# Patient Record
Sex: Male | Born: 1985 | Race: Black or African American | Hispanic: No | Marital: Single | Smoking: Never smoker
Health system: Southern US, Community
[De-identification: ages and names within clinical notes are randomized; demographics above are authoritative.]

## PROBLEM LIST (undated history)

## (undated) DIAGNOSIS — N200 Calculus of kidney: Secondary | ICD-10-CM

## (undated) HISTORY — PX: APPENDECTOMY: SHX54

---
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
## 2005-07-10 ENCOUNTER — Emergency Department (HOSPITAL_COMMUNITY): Admission: EM | Admit: 2005-07-10 | Discharge: 2005-07-10 | Payer: Self-pay | Admitting: Emergency Medicine

## 2005-10-05 ENCOUNTER — Emergency Department (HOSPITAL_COMMUNITY): Admission: EM | Admit: 2005-10-05 | Discharge: 2005-10-05 | Payer: Self-pay | Admitting: Emergency Medicine

## 2006-05-05 ENCOUNTER — Emergency Department (HOSPITAL_COMMUNITY): Admission: EM | Admit: 2006-05-05 | Discharge: 2006-05-05 | Payer: Self-pay | Admitting: Emergency Medicine

## 2006-09-05 ENCOUNTER — Emergency Department (HOSPITAL_COMMUNITY): Admission: EM | Admit: 2006-09-05 | Discharge: 2006-09-05 | Payer: Self-pay | Admitting: Emergency Medicine

## 2006-10-14 ENCOUNTER — Emergency Department (HOSPITAL_COMMUNITY): Admission: EM | Admit: 2006-10-14 | Discharge: 2006-10-14 | Payer: Self-pay | Admitting: Emergency Medicine

## 2006-11-11 ENCOUNTER — Emergency Department (HOSPITAL_COMMUNITY): Admission: EM | Admit: 2006-11-11 | Discharge: 2006-11-11 | Payer: Self-pay | Admitting: Emergency Medicine

## 2007-07-18 ENCOUNTER — Emergency Department (HOSPITAL_COMMUNITY): Admission: EM | Admit: 2007-07-18 | Discharge: 2007-07-18 | Payer: Self-pay | Admitting: Emergency Medicine

## 2007-11-12 ENCOUNTER — Emergency Department (HOSPITAL_COMMUNITY): Admission: EM | Admit: 2007-11-12 | Discharge: 2007-11-12 | Payer: Self-pay | Admitting: Emergency Medicine

## 2007-12-18 ENCOUNTER — Emergency Department (HOSPITAL_COMMUNITY): Admission: EM | Admit: 2007-12-18 | Discharge: 2007-12-18 | Payer: Self-pay | Admitting: Emergency Medicine

## 2008-02-20 ENCOUNTER — Emergency Department (HOSPITAL_COMMUNITY): Admission: EM | Admit: 2008-02-20 | Discharge: 2008-02-20 | Payer: Self-pay | Admitting: Emergency Medicine

## 2011-02-09 NOTE — Op Note (Signed)
NAME:  [name], Travis Mayer                   ACCOUNT NO.:  [id number]   MEDICAL RECORD NO.:  [id number]                   PATIENT TYPE:  EMS   LOCATION:  ED                                   FACILITY:  [facility]   PHYSICIAN:  Dalia Heading, M.D.               DATE OF BIRTH:  01-17-1986   DATE OF PROCEDURE:  11/25/2002  DATE OF DISCHARGE:                                 OPERATIVE REPORT   PREOPERATIVE DIAGNOSIS:  Acute appendicitis.   POSTOPERATIVE DIAGNOSIS:  Acute appendicitis.   PROCEDURE:  Laparoscopic appendectomy.   SURGEON:  Dalia Heading, M.D.   ANESTHESIA:  General endotracheal   INDICATIONS:  The patient is a 25 year old black male who presented to the  emergency room with right lower quadrant abdominal pain.  CT scan of the  abdomen and pelvis reveals acute appendicitis.  The risks and benefits of  the procedure including bleeding, infection, the possibility of an open  procedure were fully explained to the patient's guardian, who gave informed  consent for the patient, as the patient is a minor.   DESCRIPTION OF PROCEDURE:  The patient was placed in the supine position.  After induction of general endotracheal anesthesia, the abdomen was prepped  and draped using the usual sterile technique with Betadine.  Surgical site  confirmation was performed.   A supraumbilical incision was made down to the fascia.  A Veress needle was  introduced into the abdominal cavity and confirmation of placement was done  using the saline drop test.  The abdomen was then insufflated to 16 mmHg  pressure.  An 11-mm trocar was introduced into the abdominal cavity under  direct visualization without difficulty.  The patient was placed in deeper  Trendelenburg position and an 11-mm trocar was placed in the suprapubic  region and a 5-mm trocar was placed in the left lower quadrant region.   The appendix was visualized and noted to be inflamed. There was no purulent  fluid present.  The  mesoappendix was divided using the harmonic scalpel.  An  Endo-GIA was placed across the base of the appendix and fired.  The appendix  was delivered through the suprapubic trocar side using an EndoCatch bag.  The staple line was inspected and noted to be within normal limits.  All  fluid and air were then evacuated from the abdominal cavity prior to removal  of the trocars.   All wounds were irrigated with normal saline.  All wounds were injected with  0.5% Sensorcaine.  The supraumbilical fascia as well as suprapubic fascia  were reapproximated using an #0 Vicryl interrupted suture. All skin  incisions were closed using staples.  Betadine ointment and dry sterile  dressings were applied.   All tape and needle counts were correct at the end of the procedure.  The  patient was extubated in the operating room and went to the recovery room  awake and stable condition.   COMPLICATIONS:  None.   SPECIMEN:  Appendix.   BLOOD LOSS:  Minimal.                                               Dalia Heading, M.D.    MAJ/MEDQ  D:  11/25/2002  T:  11/26/2002  Job:  [id number]   cc:   Dr. Clista Bernhardt   [place]

## 2011-06-15 LAB — URINALYSIS, ROUTINE W REFLEX MICROSCOPIC
Bilirubin Urine: NEGATIVE
Glucose, UA: NEGATIVE
Hgb urine dipstick: NEGATIVE
Nitrite: NEGATIVE
Protein, ur: NEGATIVE
Specific Gravity, Urine: >1.03 — ABNORMAL HIGH
Urobilinogen, UA: 0.2
pH: 5.5

## 2011-07-04 LAB — FECAL LACTOFERRIN, QUANT: Fecal Lactoferrin: POSITIVE

## 2011-07-04 LAB — COMPREHENSIVE METABOLIC PANEL
ALT: 13
AST: 17
Albumin: 4.6
Alkaline Phosphatase: 73
BUN: 15
CO2: 30
Calcium: 10
Chloride: 107
Creatinine, Ser: 1.17
GFR calc Af Amer: >60
GFR calc non Af Amer: >60
Glucose, Bld: 134 — ABNORMAL HIGH
Potassium: 4.2
Sodium: 141
Total Bilirubin: 0.9
Total Protein: 7.9

## 2011-07-04 LAB — CBC
HCT: 49
Hemoglobin: 16.9
MCHC: 34.4
MCV: 89.6
Platelets: 201
RBC: 5.47
RDW: 13.2
WBC: 11.5 — ABNORMAL HIGH

## 2011-07-04 LAB — DIFFERENTIAL
Basophils Absolute: 0
Basophils Relative: 0
Eosinophils Absolute: 0
Eosinophils Relative: 0
Lymphocytes Relative: 3 — ABNORMAL LOW
Lymphs Abs: 0.3 — ABNORMAL LOW
Monocytes Absolute: 0.4
Monocytes Relative: 3
Neutro Abs: 10.7 — ABNORMAL HIGH
Neutrophils Relative %: 93 — ABNORMAL HIGH

## 2011-07-04 LAB — STOOL CULTURE

## 2011-07-04 LAB — OVA AND PARASITE EXAMINATION: Ova and parasites: NONE SEEN

## 2011-07-04 LAB — LIPASE, BLOOD: Lipase: 19

## 2016-06-13 ENCOUNTER — Emergency Department (HOSPITAL_COMMUNITY)
Admission: EM | Admit: 2016-06-13 | Discharge: 2016-06-14 | Disposition: A | Discharge status: Home / Self Care | Payer: Self-pay | Attending: Emergency Medicine | Admitting: Emergency Medicine

## 2016-06-13 ENCOUNTER — Encounter (HOSPITAL_COMMUNITY): Payer: Self-pay

## 2016-06-13 DIAGNOSIS — N201 Calculus of ureter: Secondary | ICD-10-CM | POA: Insufficient documentation

## 2016-06-13 HISTORY — DX: Calculus of kidney: N20.0

## 2016-06-13 NOTE — ED Triage Notes (Signed)
Pt c/o right flank pain that started this am, had gone away and returned tonight, states it feels like previous kidney stone.

## 2016-06-13 NOTE — ED Provider Notes (Signed)
[facility]-EMERGENCY DEPT Provider Note   CSN: [id number] Arrival date & time: 06/13/16  2332  By signing my name below, I, Javier Docker, attest that this documentation has been prepared under the direction and in the presence of Devoria Albe, MD. Electronically Signed: Javier Docker, ER Scribe. 05/05/2016. 12:04 AM.  Time seen 23:58 PM  History   Chief Complaint Chief Complaint  Patient presents with  . Flank Pain   HPI  HPI Comments: Travis Mayer is a 30 y.o. male who presents to the Emergency Department complaining of intermittent, dull RLQ and flank pain with associated nausea since this morning. He has a past surgical hx of appendectomy. His pain lasted three hours this morning, and then resolved until two hours ago when the pain recurred. His urine output is reduced.He feels the need to have a BM but doesn't. He denies hematuria. He does not drink milk. He drinks significant sweetened coffee drinks. He does not drink much water.He denies injury. He has a past hx kidney stones.   PCP none   Past Medical History:  Diagnosis Date  . Kidney stone     There are no active problems to display for this patient.   History reviewed. No pertinent surgical history.  OB History    No data available       Home Medications    Prior to Admission medications   Medication Sig Start Date End Date Taking? Authorizing Provider  ondansetron (ZOFRAN) 4 MG tablet Take 1 tablet (4 mg total) by mouth every 8 (eight) hours as needed. 06/14/16   Devoria Albe, MD  oxyCODONE-acetaminophen (PERCOCET/ROXICET) 5-325 MG tablet Take 1 tablet by mouth every 6 (six) hours as needed for severe pain. 06/14/16   Devoria Albe, MD  PERCOCET 5-325 MG tablet Take 1 tablet by mouth every 6 (six) hours as needed for severe pain. 06/14/16   Devoria Albe, MD  tamsulosin (FLOMAX) 0.4 MG CAPS capsule Take 1 po QD until you pass the stone. 06/14/16   Devoria Albe, MD    Family History No family history on  file.  Social History Social History  Substance Use Topics  . Smoking status: Never Smoker  . Smokeless tobacco: Never Used  . Alcohol use No   He does not drink or smoke.  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   Review of Systems Review of Systems  Constitutional: Negative for chills and fever.  Gastrointestinal: Positive for nausea. Negative for constipation and vomiting.  Genitourinary: Positive for decreased urine volume and flank pain.  All other systems reviewed and are negative.   Physical Exam Updated Vital Signs BP 137/92 (BP Location: Right Arm)   Pulse 76   Temp 97.4 F (36.3 C) (Oral)   Resp 18   Ht 6' (1.829 m)   Wt 205 lb (93 kg)   SpO2 99%   BMI 27.80 kg/m   Vital signs normal    Physical Exam  Constitutional: He is oriented to person, place, and time. He appears well-developed and well-nourished.  Non-toxic appearance. He does not appear ill. He appears distressed.  HENT:  Head: Normocephalic and atraumatic.  Right Ear: External ear normal.  Left Ear: External ear normal.  Nose: Nose normal. No mucosal edema or rhinorrhea.  Mouth/Throat: Oropharynx is clear and moist and mucous membranes are normal. No dental abscesses or uvula swelling.  Eyes: Conjunctivae and EOM are normal.  Pupils are equal, round, and reactive to light.  Neck: Normal range of motion and full passive range of motion without pain. Neck supple.  Cardiovascular: Normal rate, regular rhythm and normal heart sounds.  Exam reveals no gallop and no friction rub.   No murmur heard. Pulmonary/Chest: Effort normal and breath sounds normal. No respiratory distress. He has no wheezes. He has no rhonchi. He has no rales. He exhibits no tenderness and no crepitus.  Abdominal: Soft. Normal appearance and bowel sounds are normal. He exhibits no distension. There is tenderness. There is no rebound and no  guarding.  No flank pain to percussion, but that is the area he indicates that he hurts. He has mild tenderness in the right lower abdomen.   Musculoskeletal: Normal range of motion. He exhibits no edema or tenderness.  Moves all extremities well.   Neurological: He is alert and oriented to person, place, and time. He has normal strength. No cranial nerve deficit.  Skin: Skin is warm, dry and intact. No rash noted. No erythema. No pallor.  Psychiatric: He has a normal mood and affect. His speech is normal and behavior is normal. His mood appears not anxious.  Nursing note and vitals reviewed.    ED Treatments / Results  Labs (all labs ordered are listed, but only abnormal results are displayed) Results for orders placed or performed during the hospital encounter of 06/13/16  Urinalysis, Routine w reflex microscopic- may I&O cath if menses  Result Value Ref Range   Color, Urine YELLOW YELLOW   APPearance CLEAR CLEAR   Specific Gravity, Urine 1.025 1.005 - 1.030   pH 6.0 5.0 - 8.0   Glucose, UA NEGATIVE NEGATIVE mg/dL   Hgb urine dipstick LARGE (A) NEGATIVE   Bilirubin Urine NEGATIVE NEGATIVE   Ketones, ur TRACE (A) NEGATIVE mg/dL   Protein, ur NEGATIVE NEGATIVE mg/dL   Nitrite NEGATIVE NEGATIVE   Leukocytes, UA NEGATIVE NEGATIVE  Basic metabolic panel  Result Value Ref Range   Sodium 140 135 - 145 mmol/L   Potassium 3.9 3.5 - 5.1 mmol/L   Chloride 106 101 - 111 mmol/L   CO2 30 22 - 32 mmol/L   Glucose, Bld 132 (H) 65 - 99 mg/dL   BUN 20 6 - 20 mg/dL   Creatinine, Ser 1.61 (H) 0.61 - 1.24 mg/dL   Calcium 9.3 8.9 - 09.6 mg/dL   GFR calc non Af Amer >60 >60 mL/min   GFR calc Af Amer >60 >60 mL/min   Anion gap 4 (L) 5 - 15  CBC with Differential  Result Value Ref Range   WBC 7.8 4.0 - 10.5 K/uL   RBC 4.73 4.22 - 5.81 MIL/uL   Hemoglobin 14.8 13.0 - 17.0 g/dL   HCT 04.5 40.9 - 81.1 %   MCV 90.9 78.0 - 100.0 fL   MCH 31.3 26.0 - 34.0 pg   MCHC 34.4 30.0 - 36.0 g/dL   RDW  91.4 78.2 - 95.6 %   Platelets 196 150 - 400 K/uL   Neutrophils Relative % 49 %   Neutro Abs 3.8 1.7 - 7.7 K/uL   Lymphocytes Relative 36 %   Lymphs Abs 2.9 0.7 - 4.0 K/uL   Monocytes Relative 12 %   Monocytes Absolute 1.0 0.1 - 1.0 K/uL   Eosinophils Relative 3 %   Eosinophils Absolute 0.2 0.0 - 0.7 K/uL   Basophils Relative 0 %   Basophils Absolute 0.0 0.0 - 0.1 K/uL  Urine microscopic-add on  Result  Value Ref Range   Squamous Epithelial / LPF 0-5 (A) NONE SEEN   WBC, UA 0-5 0 - 5 WBC/hpf   RBC / HPF TOO NUMEROUS TO COUNT 0 - 5 RBC/hpf   Bacteria, UA FEW (A) NONE SEEN   Laboratory interpretation all normal except hematuria, renal insufficiency       Radiology Ct Renal Stone Study  Result Date: 06/14/2016 CLINICAL DATA:  Pt c/o right flank pain that started this am, had gone away and returned tonight, states it feels like previous kidney stone. EXAM: CT ABDOMEN AND PELVIS WITHOUT CONTRAST TECHNIQUE: Multidetector CT imaging of the abdomen and pelvis was performed following the standard protocol without IV contrast. COMPARISON:  11/12/2007 FINDINGS: Lower chest: No acute abnormality. Hepatobiliary: Unenhanced appearance is unremarkable. Pancreas: Unenhanced appearance is unremarkable peer Spleen: Unenhanced appearance is unremarkable. Adrenals/Urinary Tract: No adrenal gland nodules. Kidneys are symmetrical. No hydronephrosis or hydroureter. There is a punctate size stone in the distal right ureter just above the ureterovesical junction. This stone measures less than 2 mm diameter. No significant stranding. No additional stones demonstrated. Bladder wall is not thickened. Stomach/Bowel: Stomach contains ingested material. No distention. Small bowel and colon are mostly decompressed. Scattered stool in the colon. Surgical absence of the appendix. Vascular/Lymphatic: No significant vascular findings are present. No enlarged abdominal or pelvic lymph nodes. Reproductive: Prostate is  unremarkable. Other: No abdominal wall hernia or abnormality. No abdominopelvic ascites. Musculoskeletal: No acute or significant osseous findings. IMPRESSION: Less than 2 mm stone in the distal right ureter without significant proximal obstructive changes. Electronically Signed   By: Burman Nieves M.D.   On: 06/14/2016 00:50    Procedures Procedures (including critical care time)  Medications Ordered in ED Medications  fentaNYL (SUBLIMAZE) injection 50 mcg (50 mcg Intravenous Given 06/14/16 0059)  ondansetron (ZOFRAN) injection 4 mg (4 mg Intravenous Given 06/14/16 0059)  ketorolac (TORADOL) 30 MG/ML injection 30 mg (30 mg Intravenous Given 06/14/16 0127)  fentaNYL (SUBLIMAZE) injection 50 mcg (50 mcg Intravenous Given 06/14/16 0223)     Initial Impression / Assessment and Plan / ED Course  I have reviewed the triage vital signs and the nursing notes.  Pertinent labs & imaging results that were available during my care of the patient were reviewed by me and considered in my medical decision making (see chart for details).  Clinical Course   We discussed his symptoms sounded similar to a ureteral stone. Review of his prior ED visit shows in 2009 he was actually seen by myself and had a CT scan showing a right ureteral stone. He was given IV pain and nausea medication.  After reviewing his CT scan he was given IV Toradol.  1:25 AM patient and his family were given the results of his CT. He states now his pain is in his very lower right abdomen so hopefully he will pass the stone shortly.  Recheck 02:05 AM patient laying quietly on the stretcher, states he still has a mild amount of pain, more pain meds written.   02:55 AM pt states he feels ready to be discharged.   Review of the [place] shows no entries in the past 6 months  Final Clinical Impressions(s) / ED Diagnoses   Final diagnoses:  Right ureteral stone    New Prescriptions New Prescriptions    ONDANSETRON (ZOFRAN) 4 MG TABLET    Take 1 tablet (4 mg total) by mouth every 8 (eight) hours as needed.   OXYCODONE-ACETAMINOPHEN (PERCOCET/ROXICET) 5-325 MG TABLET  Take 1 tablet by mouth every 6 (six) hours as needed for severe pain.   PERCOCET 5-325 MG TABLET    Take 1 tablet by mouth every 6 (six) hours as needed for severe pain.   TAMSULOSIN (FLOMAX) 0.4 MG CAPS CAPSULE    Take 1 po QD until you pass the stone.   Plan discharge  Devoria AlbeIva Tukker Byrns, MD, FACEP   I personally performed the services described in this documentation, which was scribed in my presence. The recorded information has been reviewed and considered.  Devoria AlbeIva Telesa Jeancharles, MD, [place] PyoFACEP         Navdeep Halt, MD 06/14/16 0300

## 2016-06-14 ENCOUNTER — Emergency Department (HOSPITAL_COMMUNITY): Payer: Self-pay

## 2016-06-14 LAB — CBC WITH DIFFERENTIAL/PLATELET
Basophils Absolute: 0 10*3/uL (ref 0.0–0.1)
Basophils Relative: 0 %
Eosinophils Absolute: 0.2 10*3/uL (ref 0.0–0.7)
Eosinophils Relative: 3 %
HCT: 43 % (ref 39.0–52.0)
Hemoglobin: 14.8 g/dL (ref 13.0–17.0)
Lymphocytes Relative: 36 %
Lymphs Abs: 2.9 10*3/uL (ref 0.7–4.0)
MCH: 31.3 pg (ref 26.0–34.0)
MCHC: 34.4 g/dL (ref 30.0–36.0)
MCV: 90.9 fL (ref 78.0–100.0)
Monocytes Absolute: 1 10*3/uL (ref 0.1–1.0)
Monocytes Relative: 12 %
Neutro Abs: 3.8 10*3/uL (ref 1.7–7.7)
Neutrophils Relative %: 49 %
Platelets: 196 10*3/uL (ref 150–400)
RBC: 4.73 MIL/uL (ref 4.22–5.81)
RDW: 12.6 % (ref 11.5–15.5)
WBC: 7.8 10*3/uL (ref 4.0–10.5)

## 2016-06-14 LAB — BASIC METABOLIC PANEL
Anion gap: 4 — ABNORMAL LOW (ref 5–15)
BUN: 20 mg/dL (ref 6–20)
CO2: 30 mmol/L (ref 22–32)
Calcium: 9.3 mg/dL (ref 8.9–10.3)
Chloride: 106 mmol/L (ref 101–111)
Creatinine, Ser: 1.32 mg/dL — ABNORMAL HIGH (ref 0.61–1.24)
GFR calc Af Amer: >60 mL/min (ref >60)
GFR calc non Af Amer: >60 mL/min (ref >60)
Glucose, Bld: 132 mg/dL — ABNORMAL HIGH (ref 65–99)
Potassium: 3.9 mmol/L (ref 3.5–5.1)
Sodium: 140 mmol/L (ref 135–145)

## 2016-06-14 LAB — URINE MICROSCOPIC-ADD ON

## 2016-06-14 LAB — URINALYSIS, ROUTINE W REFLEX MICROSCOPIC
Bilirubin Urine: NEGATIVE
Glucose, UA: NEGATIVE mg/dL
Leukocytes, UA: NEGATIVE
Nitrite: NEGATIVE
Protein, ur: NEGATIVE mg/dL
Specific Gravity, Urine: 1.025 (ref 1.005–1.030)
pH: 6 (ref 5.0–8.0)

## 2016-06-14 MED ORDER — PERCOCET 5-325 MG PO TABS: 1.0000 | ORAL_TABLET | Freq: Four times a day (QID) | ORAL | 0 refills | Status: DC | PRN

## 2016-06-14 MED ORDER — ONDANSETRON HCL 4 MG PO TABS: 4.0000 mg | ORAL_TABLET | Freq: Three times a day (TID) | ORAL | 0 refills | Status: DC | PRN

## 2016-06-14 MED ORDER — KETOROLAC TROMETHAMINE 30 MG/ML IJ SOLN
INTRAMUSCULAR | Status: AC
  Filled 2016-06-14: qty 1

## 2016-06-14 MED ORDER — FENTANYL CITRATE (PF) 100 MCG/2ML IJ SOLN
50.0000 ug | Freq: Once | INTRAMUSCULAR | Status: AC
  Administered 2016-06-14: 50 ug via INTRAVENOUS

## 2016-06-14 MED ORDER — FENTANYL CITRATE (PF) 100 MCG/2ML IJ SOLN
50.0000 ug | Freq: Once | INTRAMUSCULAR | Status: AC
  Administered 2016-06-14: 50 ug via INTRAVENOUS
  Filled 2016-06-14: qty 2

## 2016-06-14 MED ORDER — OXYCODONE-ACETAMINOPHEN 5-325 MG PO TABS: 1.0000 | ORAL_TABLET | Freq: Four times a day (QID) | ORAL | 0 refills | Status: DC | PRN

## 2016-06-14 MED ORDER — ONDANSETRON HCL 4 MG/2ML IJ SOLN
4.0000 mg | Freq: Once | INTRAMUSCULAR | Status: AC
  Administered 2016-06-14: 4 mg via INTRAVENOUS
  Filled 2016-06-14: qty 2

## 2016-06-14 MED ORDER — TAMSULOSIN HCL 0.4 MG PO CAPS: ORAL_CAPSULE | ORAL | 0 refills | Status: DC

## 2016-06-14 MED ORDER — KETOROLAC TROMETHAMINE 30 MG/ML IJ SOLN
30.0000 mg | Freq: Once | INTRAMUSCULAR | Status: AC
  Administered 2016-06-14: 30 mg via INTRAVENOUS

## 2016-06-14 NOTE — Discharge Instructions (Addendum)
Drink plenty of fluids. Take the medication as prescribed. Recheck if you get a fever, or have uncontrolled vomiting or pain. Otherwise you should pass this stone in the next couple of days. If it hasn't passed after a week, call Dr Dahlstedt's office (a Urologist) to be seen. Try to cut back on your caffeine ingestion so you don't form any more kidney stones.

## 2016-06-14 NOTE — ED Notes (Signed)
Pt request pain medication. MD notified

## 2016-06-20 MED FILL — Oxycodone w/ Acetaminophen Tab 5-325 MG: ORAL | Qty: 6 | Status: AC

## 2016-09-22 ENCOUNTER — Encounter (HOSPITAL_COMMUNITY): Payer: Self-pay

## 2016-09-22 ENCOUNTER — Emergency Department (HOSPITAL_COMMUNITY)
Admission: EM | Admit: 2016-09-22 | Discharge: 2016-09-22 | Disposition: A | Discharge status: Home / Self Care | Payer: Self-pay | Attending: Emergency Medicine | Admitting: Emergency Medicine

## 2016-09-22 DIAGNOSIS — S01111A Laceration without foreign body of right eyelid and periocular area, initial encounter: Secondary | ICD-10-CM | POA: Insufficient documentation

## 2016-09-22 DIAGNOSIS — S0181XA Laceration without foreign body of other part of head, initial encounter: Secondary | ICD-10-CM

## 2016-09-22 DIAGNOSIS — S0990XA Unspecified injury of head, initial encounter: Secondary | ICD-10-CM | POA: Insufficient documentation

## 2016-09-22 DIAGNOSIS — Z23 Encounter for immunization: Secondary | ICD-10-CM | POA: Insufficient documentation

## 2016-09-22 DIAGNOSIS — Y93E5 Activity, floor mopping and cleaning: Secondary | ICD-10-CM | POA: Insufficient documentation

## 2016-09-22 DIAGNOSIS — W01198A Fall on same level from slipping, tripping and stumbling with subsequent striking against other object, initial encounter: Secondary | ICD-10-CM | POA: Insufficient documentation

## 2016-09-22 DIAGNOSIS — Y929 Unspecified place or not applicable: Secondary | ICD-10-CM | POA: Insufficient documentation

## 2016-09-22 DIAGNOSIS — Y999 Unspecified external cause status: Secondary | ICD-10-CM | POA: Insufficient documentation

## 2016-09-22 MED ORDER — BACITRACIN ZINC 500 UNIT/GM EX OINT
TOPICAL_OINTMENT | Freq: Once | CUTANEOUS | Status: AC
  Administered 2016-09-22: 1 via TOPICAL
  Filled 2016-09-22: qty 1.8

## 2016-09-22 MED ORDER — TETANUS-DIPHTH-ACELL PERTUSSIS 5-2.5-18.5 LF-MCG/0.5 IM SUSP
0.5000 mL | Freq: Once | INTRAMUSCULAR | Status: AC
  Administered 2016-09-22: 0.5 mL via INTRAMUSCULAR
  Filled 2016-09-22: qty 0.5

## 2016-09-22 MED ORDER — LIDOCAINE-EPINEPHRINE (PF) 1 %-1:200000 IJ SOLN
10.0000 mL | Freq: Once | INTRAMUSCULAR | Status: AC
  Administered 2016-09-22: 10 mL via INTRADERMAL
  Filled 2016-09-22: qty 30

## 2016-09-22 NOTE — Discharge Instructions (Addendum)
You may alternate between Tylenol 1000 mg every 6 hours as needed for pain and ibuprofen 800 mg every 8 hours as needed for pain. We have updated your tetanus vaccination today.  Your sutures should fall out on their own in 1-2 weeks. If they are still present in 2 weeks, please return to the emergency department or your primary care physician's office to have them removed. You may clean this area gently with warm soap and water and apply Neosporin twice a day.

## 2016-09-22 NOTE — ED Provider Notes (Signed)
TIME SEEN: 2:10 AM  CHIEF COMPLAINT: Right upper eyelid laceration  HPI: Pt is a 30 y.o. male with history of kidney stones who presents to the emergency department with upper right eyelid laceration. Reports that he was cleaning a wooden cabinet when he tripped and fell on the edge of the cabinet causing a laceration to the upper eyelid. Did not get knocked unconscious. Denies headache, neck or back pain, chest or abdominal pain. No other injury. Did not fall to the ground. No numbness or focal weakness. Is not sure when his last tetanus vaccination was. Drove himself to the emergency department. Denies any vision changes or pain in his eye.  ROS: See HPI Constitutional: no fever  Eyes: no drainage  ENT: no runny nose   Cardiovascular:  no chest pain  Resp: no SOB  GI: no vomiting GU: no dysuria Integumentary: no rash  Allergy: no hives  Musculoskeletal: no leg swelling  Neurological: no slurred speech ROS otherwise negative  PAST MEDICAL HISTORY/PAST SURGICAL HISTORY:  Past Medical History:  Diagnosis Date  . Kidney stone     MEDICATIONS:  Prior to Admission medications   Medication Sig Start Date End Date Taking? Authorizing Provider  ondansetron (ZOFRAN) 4 MG tablet Take 1 tablet (4 mg total) by mouth every 8 (eight) hours as needed. 06/14/16   Devoria AlbeIva Knapp, MD  oxyCODONE-acetaminophen (PERCOCET/ROXICET) 5-325 MG tablet Take 1 tablet by mouth every 6 (six) hours as needed for severe pain. 06/14/16   Devoria AlbeIva Knapp, MD  PERCOCET 5-325 MG tablet Take 1 tablet by mouth every 6 (six) hours as needed for severe pain. 06/14/16   Devoria AlbeIva Knapp, MD  tamsulosin (FLOMAX) 0.4 MG CAPS capsule Take 1 po QD until you pass the stone. 06/14/16   Devoria AlbeIva Knapp, MD    ALLERGIES:  No Known Allergies  SOCIAL HISTORY:  Social History  Substance Use Topics  . Smoking status: Never Smoker  . Smokeless tobacco: Never Used  . Alcohol use No    FAMILY HISTORY: No family history on file.  EXAM: BP 150/86 (BP  Location: Left Arm)   Pulse 85   Temp 98.1 F (36.7 C) (Oral)   Resp 20   Ht 5\' 11"  (1.803 m)   Wt 225 lb (102.1 kg)   SpO2 100%   BMI 31.38 kg/m  CONSTITUTIONAL: Alert and oriented and responds appropriately to questions. Well-appearing; well-nourished; GCS 15 HEAD: Normocephalic; 3 cm superficial laceration just below the right eyebrow that is superficial and does not involve any periorbital fat or the lid margin EYES: Conjunctivae clear, PERRL, EOMI, Normal visual fields, no subconjunctival hemorrhage, no chemosis ENT: normal nose; no rhinorrhea; moist mucous membranes; pharynx without lesions noted; no dental injury; no septal hematoma NECK: Supple, no meningismus, no LAD; no midline spinal tenderness, step-off or deformity; trachea midline CARD: RRR; S1 and S2 appreciated; no murmurs, no clicks, no rubs, no gallops RESP: Normal chest excursion without splinting or tachypnea; breath sounds clear and equal bilaterally; no wheezes, no rhonchi, no rales; no hypoxia or respiratory distress CHEST:  chest wall stable, no crepitus or ecchymosis or deformity, nontender to palpation; no flail chest ABD/GI: Normal bowel sounds; non-distended; soft, non-tender, no rebound, no guarding; no ecchymosis or other lesions noted PELVIS:  stable, nontender to palpation BACK:  The back appears normal and is non-tender to palpation, there is no CVA tenderness; no midline spinal tenderness, step-off or deformity EXT: Normal ROM in all joints; non-tender to palpation; no edema; normal capillary refill; no  cyanosis, no bony tenderness or bony deformity of patient's extremities, no joint effusion, compartments are soft, extremities are warm and well-perfused, no ecchymosis or lacerations    SKIN: Normal color for age and race; warm NEURO: Moves all extremities equally, sensation to light touch intact diffusely, cranial nerves II through XII intact PSYCH: The patient's mood and manner are appropriate. Grooming and  personal hygiene are appropriate.  MEDICAL DECISION MAKING: Patient here with mechanical fall. Laceration just below the right eyelid. We have repaired this, updated his tetanus. Discussed head injury return precautions. I do not feel he needs imaging of his head at this time as he is neurologically intact, under the age of 30, not any antiplatelets or anticoagulation, not intoxicated. No neck pain on exam.  Recommended applying ice to this area as needed, alternating Tylenol and Motrin for pain. Discussed return precautions.    At this time, I do not feel there is any life-threatening condition present. I have reviewed and discussed all results (EKG, imaging, lab, urine as appropriate) and exam findings with patient/family. I have reviewed nursing notes and appropriate previous records.  I feel the patient is safe to be discharged home without further emergent workup and can continue workup as an outpatient as needed. Discussed usual and customary return precautions. Patient/family verbalize understanding and are comfortable with this plan.  Outpatient follow-up has been provided. All questions have been answered.     LACERATION REPAIR Performed by: Raelyn NumberWARD, Roye Gustafson N Authorized by: Raelyn NumberWARD, Kaedon Fanelli N Consent: Verbal consent obtained. Risks and benefits: risks, benefits and alternatives were discussed Consent given by: patient Patient identity confirmed: provided demographic data Prepped and Draped in normal sterile fashion Wound explored  Laceration Location: Right upper eyelid  Laceration Length: 3 cm  No Foreign Bodies seen or palpated  Anesthesia: local infiltration  Local anesthetic: lidocaine 1 % with epinephrine  Anesthetic total: 4 ml  Irrigation method: syringe Amount of cleaning: standard  Skin closure: Superficial, simple   Number of sutures: 4   Technique: Area anesthetized using lidocaine 1% with epinephrine. Wound irrigated copiously with sterile saline. Wound then  cleaned with Betadine and draped in sterile fashion. Wound closed using 4 simple interrupted sutures with 4-0 fast absorbable suture.  Bacitracin and sterile dressing applied. Good wound approximation and hemostasis achieved.    Patient tolerance: Patient tolerated the procedure well with no immediate complications.      Layla MawKristen N Keundra Petrucelli, DO 09/22/16 [phone]

## 2016-09-22 NOTE — ED Triage Notes (Signed)
Pt states he was cleaning a wooden cabinet when he tripped and fell into the edge of the cabinet causing a laceration to his right eyelid.  Pt has been holding pressure to same to control bleeding.

## 2017-10-25 ENCOUNTER — Emergency Department (HOSPITAL_COMMUNITY)
Admission: EM | Admit: 2017-10-25 | Discharge: 2017-10-25 | Disposition: A | Discharge status: Home / Self Care | Payer: Self-pay | Attending: Emergency Medicine | Admitting: Emergency Medicine

## 2017-10-25 ENCOUNTER — Encounter (HOSPITAL_COMMUNITY): Payer: Self-pay

## 2017-10-25 ENCOUNTER — Emergency Department (HOSPITAL_COMMUNITY): Payer: Self-pay

## 2017-10-25 DIAGNOSIS — R05 Cough: Secondary | ICD-10-CM | POA: Insufficient documentation

## 2017-10-25 DIAGNOSIS — J101 Influenza due to other identified influenza virus with other respiratory manifestations: Secondary | ICD-10-CM | POA: Insufficient documentation

## 2017-10-25 DIAGNOSIS — Z79899 Other long term (current) drug therapy: Secondary | ICD-10-CM | POA: Insufficient documentation

## 2017-10-25 LAB — CBC WITH DIFFERENTIAL/PLATELET
Basophils Absolute: 0 10*3/uL (ref 0.0–0.1)
Basophils Relative: 0 %
EOS ABS: 0 10*3/uL (ref 0.0–0.7)
Eosinophils Relative: 0 %
HCT: 43.4 % (ref 39.0–52.0)
HEMOGLOBIN: 14 g/dL (ref 13.0–17.0)
LYMPHS ABS: 1 10*3/uL (ref 0.7–4.0)
LYMPHS PCT: 22 %
MCH: 29.7 pg (ref 26.0–34.0)
MCHC: 32.3 g/dL (ref 30.0–36.0)
MCV: 91.9 fL (ref 78.0–100.0)
MONOS PCT: 29 %
Monocytes Absolute: 1.3 10*3/uL — ABNORMAL HIGH (ref 0.1–1.0)
NEUTROS PCT: 49 %
Neutro Abs: 2.2 10*3/uL (ref 1.7–7.7)
Platelets: 166 10*3/uL (ref 150–400)
RBC: 4.72 MIL/uL (ref 4.22–5.81)
RDW: 12.9 % (ref 11.5–15.5)
WBC: 4.5 10*3/uL (ref 4.0–10.5)

## 2017-10-25 LAB — BASIC METABOLIC PANEL
Anion gap: 10 (ref 5–15)
BUN: 11 mg/dL (ref 6–20)
CHLORIDE: 98 mmol/L — AB (ref 101–111)
CO2: 26 mmol/L (ref 22–32)
CREATININE: 1.26 mg/dL — AB (ref 0.61–1.24)
Calcium: 8.8 mg/dL — ABNORMAL LOW (ref 8.9–10.3)
GFR calc Af Amer: >60 mL/min (ref >60)
GFR calc non Af Amer: >60 mL/min (ref >60)
GLUCOSE: 116 mg/dL — AB (ref 65–99)
POTASSIUM: 3.8 mmol/L (ref 3.5–5.1)
SODIUM: 134 mmol/L — AB (ref 135–145)

## 2017-10-25 LAB — INFLUENZA PANEL BY PCR (TYPE A & B)
Influenza A By PCR: POSITIVE — AB
Influenza B By PCR: NEGATIVE

## 2017-10-25 MED ORDER — ACETAMINOPHEN 325 MG PO TABS
650.0000 mg | ORAL_TABLET | Freq: Once | ORAL | Status: AC
  Administered 2017-10-25: 650 mg via ORAL
  Filled 2017-10-25: qty 2

## 2017-10-25 MED ORDER — ACETAMINOPHEN 325 MG PO TABS: 650.0000 mg | ORAL_TABLET | Freq: Four times a day (QID) | ORAL | 0 refills | Status: DC | PRN

## 2017-10-25 MED ORDER — GUAIFENESIN ER 600 MG PO TB12: 600.0000 mg | ORAL_TABLET | Freq: Two times a day (BID) | ORAL | 0 refills | Status: DC

## 2017-10-25 MED ORDER — IBUPROFEN 600 MG PO TABS: 600.0000 mg | ORAL_TABLET | Freq: Four times a day (QID) | ORAL | 0 refills | Status: DC | PRN

## 2017-10-25 NOTE — ED Provider Notes (Signed)
[facility] EMERGENCY DEPARTMENT Provider Note   CSN: [id number] Arrival date & time: 10/25/17  1210     History   Chief Complaint Chief Complaint  Patient presents with  . Fever  . Cough    HPI Travis Mayer is a 32 y.o. male.  HPI   32 y/o male presents to the ED c/o of a fever that began 3 days ago. States temperature was been around 103F yesterday. He also complaints of chills/sweats, productive cough with yellow sputum, congestion, rhinorrhea, left sided ear fullness, frontal headaches, and fatigue. States it feels like small intermittent sharp pins to bilat anterior chest when he coughs. He denies any chest pain/heaviness at rest or with cough. Also reports watery diarrhea for 2 days. Has had 5 episodes today. Had 6-7 episodes yesterday.  Denies any blood in stool and no recent abx use. He denies sore throat, shortness of breath,neck stiffness, neck pain, nausea, vomiting, abdominal pain, or urinary sxs.  Has taken Nyquil, Tylenol, alka seltzer plus, theraflu for symptoms. Last dose of Tylenol was yesterday.  Pt states that his whole family has been sick with similar symptoms. Denies any recent abx use.   Past Medical History:  Diagnosis Date  . Kidney stone     There are no active problems to display for this patient.   Past Surgical History:  Procedure Laterality Date  . APPENDECTOMY         Home Medications    Prior to Admission medications   Medication Sig Start Date End Date Taking? Authorizing Provider  acetaminophen (TYLENOL) 325 MG tablet Take 2 tablets (650 mg total) by mouth every 6 (six) hours as needed. 10/25/17   Columbia Pandey S, PA-C  guaiFENesin (MUCINEX) 600 MG 12 hr tablet Take 1 tablet (600 mg total) by mouth 2 (two) times daily. 10/25/17   Jaishon Krisher S, PA-C  ibuprofen (ADVIL,MOTRIN) 600 MG tablet Take 1 tablet (600 mg total) by mouth every 6 (six) hours as needed. 10/25/17   Teshawn Moan S, PA-C  ondansetron (ZOFRAN) 4 MG tablet  Take 1 tablet (4 mg total) by mouth every 8 (eight) hours as needed. 06/14/16   Devoria AlbeKnapp, Iva, MD  oxyCODONE-acetaminophen (PERCOCET/ROXICET) 5-325 MG tablet Take 1 tablet by mouth every 6 (six) hours as needed for severe pain. 06/14/16   Devoria AlbeKnapp, Iva, MD  PERCOCET 5-325 MG tablet Take 1 tablet by mouth every 6 (six) hours as needed for severe pain. 06/14/16   Devoria AlbeKnapp, Iva, MD  tamsulosin (FLOMAX) 0.4 MG CAPS capsule Take 1 po QD until you pass the stone. 06/14/16   Devoria AlbeKnapp, Iva, MD    Family History No family history on file.  Social History Social History   Tobacco Use  . Smoking status: Never Smoker  . Smokeless tobacco: Never Used  Substance Use Topics  . Alcohol use: No  . Drug use: No     Allergies   Patient has no known allergies.   Review of Systems Review of Systems  Constitutional: Positive for chills, fatigue and fever.  HENT: Negative for ear pain, sinus pressure, sinus pain and sore throat.        Productive cough with yellow sputum, congestion, rhinorrhea, left sided ear fullness,  Eyes: Negative for pain and visual disturbance.  Respiratory: Positive for cough. Negative for shortness of breath.   Cardiovascular: Negative for chest pain and palpitations.  Gastrointestinal: Positive for diarrhea. Negative for abdominal pain, blood in stool, constipation and vomiting.  Genitourinary: Negative for dysuria and  hematuria.  Musculoskeletal: Negative for arthralgias, back pain, neck pain and neck stiffness.  Skin: Negative for color change and rash.  Neurological: Positive for headaches (frontal). Negative for dizziness, seizures and syncope.  All other systems reviewed and are negative.    Physical Exam Updated Vital Signs BP 127/77   Pulse 100   Temp (!) 100.8 F (38.2 C) (Oral)   Resp 18   Ht 5\' 10"  (1.778 m)   Wt 90.7 kg (200 lb)   SpO2 96%   BMI 28.70 kg/m   Physical Exam  Constitutional: He appears well-developed and well-nourished. No distress.  HENT:  Head:  Normocephalic and atraumatic.  Right Ear: External ear normal.  Left Ear: External ear normal.  Mouth/Throat: Oropharynx is clear and moist.  bilat TMs normal.  No tonsillar exudates or pharyngeal erythema.  Postnasal drip noted.  No evidence of peritonsillar abscess.  Uvula midline  Eyes: Conjunctivae and EOM are normal. Pupils are equal, round, and reactive to light.  Neck: Normal range of motion. Neck supple.  FROM, no nuchal rigidity  Cardiovascular: Normal rate, regular rhythm, normal heart sounds and intact distal pulses.  No murmur heard. Pulmonary/Chest: Effort normal and breath sounds normal. No respiratory distress. He has no wheezes. He has no rales.  Abdominal: Soft. Bowel sounds are normal. He exhibits no distension. There is no tenderness. There is no guarding.  Musculoskeletal: He exhibits no edema.  Lymphadenopathy:    He has no cervical adenopathy.  Neurological: He is alert. No cranial nerve deficit or sensory deficit. He exhibits normal muscle tone.  Skin: Skin is warm and dry.  Psychiatric: He has a normal mood and affect.  Nursing note and vitals reviewed.    ED Treatments / Results  Labs (all labs ordered are listed, but only abnormal results are displayed) Labs Reviewed  INFLUENZA PANEL BY PCR (TYPE A & B) - Abnormal; Notable for the following components:      Result Value   Influenza A By PCR POSITIVE (*)    All other components within normal limits  CBC WITH DIFFERENTIAL/PLATELET - Abnormal; Notable for the following components:   Monocytes Absolute 1.3 (*)    All other components within normal limits  BASIC METABOLIC PANEL - Abnormal; Notable for the following components:   Sodium 134 (*)    Chloride 98 (*)    Glucose, Bld 116 (*)    Creatinine, Ser 1.26 (*)    Calcium 8.8 (*)    All other components within normal limits    EKG  EKG Interpretation None       Radiology Dg Chest 2 View  Result Date: 10/25/2017 CLINICAL DATA:  Productive  cough, congestion fever and chills x 3 days. EXAM: CHEST  2 VIEW COMPARISON:  09/11/2004 FINDINGS: Shallow lung inflation. The heart size and mediastinal contours are within normal limits. Both lungs are clear. The visualized skeletal structures are unremarkable. IMPRESSION: No evidence for acute  abnormality.  Shallow inspiration. Electronically Signed   By: Norva Pavlov M.D.   On: 10/25/2017 13:25    Procedures Procedures (including critical care time)  Medications Ordered in ED Medications  acetaminophen (TYLENOL) tablet 650 mg (650 mg Oral Given 10/25/17 1444)     Initial Impression / Assessment and Plan / ED Course  I have reviewed the triage vital signs and the nursing notes.  Pertinent labs & imaging results that were available during my care of the patient were reviewed by me and considered in my medical decision  making (see chart for details).    Rechecked patient.  No acute distress.  Tolerating p.o. No episodes of diarrhea in the ED.  Discussed all results and plan for discharge with symptomatic treatment.  No Tamiflu as symptoms greater than 48 hours.   Final Clinical Impressions(s) / ED Diagnoses   Final diagnoses:  Influenza A   Patient with symptoms consistent with influenza.  Vitals are stable, low-grade fever.  No signs of dehydration, tolerating PO's.  Lungs are clear.  X-ray was ordered and was negative for any infiltrates.  Discussed that Tamiflu would not be given as symptoms have been present for greater than 48 hours.   The patient understands that symptoms are greater than the recommended 24-48 hour window of treatment.  Patient will be discharged with instructions to orally hydrate, rest, and use over-the-counter medications such as anti-inflammatories ibuprofen and Aleve for muscle aches and Tylenol for fever.  Patient also given Mucinex.  Nontoxic-appearing.  Advised pcp follow-up and return precautions given.  ED Discharge Orders        Ordered     acetaminophen (TYLENOL) 325 MG tablet  Every 6 hours PRN     10/25/17 1439    ibuprofen (ADVIL,MOTRIN) 600 MG tablet  Every 6 hours PRN     10/25/17 1439    guaiFENesin (MUCINEX) 600 MG 12 hr tablet  2 times daily     10/25/17 1439       Laticha Ferrucci S, PA-C 10/25/17 1847    Karrie Meres, PA-C 10/25/17 1850    Eber Hong, MD 10/27/17 [phone]

## 2017-10-25 NOTE — ED Triage Notes (Signed)
Pt reports fever, productive cough with yellow sputum, and diarrhea x 2 days.  Reports chills.  Last had diarrhea today.  Reports 4 diarrhea stools over the past 24 hours.  Reports chest feels like pins and needles when he coughs.

## 2017-10-25 NOTE — ED Notes (Signed)
flulike sx for the last 2 days  No flu shot this year  No PCP per pt  No OTC meds

## 2017-10-25 NOTE — ED Notes (Signed)
To Rad 

## 2017-10-25 NOTE — ED Notes (Signed)
Call to lab  

## 2017-10-25 NOTE — ED Notes (Signed)
Orthostatics:  Lying  123/73, 88,16,96 per cent RA  Sitting 119/74, 92,16, 95 percent RA  Standing   128/80 98,16, 97 percent RA

## 2017-10-25 NOTE — Discharge Instructions (Signed)
You may take Tylenol and ibuprofen for your fever.  You can take Mucinex for your congestion and cough, but make sure to drink a full glass of water when you take this medication.  Please follow-up with primary care within 5-7 days for reevaluation and to follow-up on results of your laboratory work.  Please return to the ER for any persistent diarrhea, vomiting, dehydration or any new or worsening symptoms.

## 2017-10-25 NOTE — ED Notes (Signed)
Pt has not been to [facility] since roomed

## 2018-02-28 ENCOUNTER — Encounter (HOSPITAL_COMMUNITY): Payer: Self-pay | Admitting: Emergency Medicine

## 2018-02-28 ENCOUNTER — Emergency Department (HOSPITAL_COMMUNITY)
Admission: EM | Admit: 2018-02-28 | Discharge: 2018-03-01 | Disposition: A | Discharge status: Home / Self Care | Payer: Self-pay | Attending: Emergency Medicine | Admitting: Emergency Medicine

## 2018-02-28 ENCOUNTER — Other Ambulatory Visit: Payer: Self-pay

## 2018-02-28 DIAGNOSIS — W57XXXA Bitten or stung by nonvenomous insect and other nonvenomous arthropods, initial encounter: Secondary | ICD-10-CM | POA: Insufficient documentation

## 2018-02-28 DIAGNOSIS — B349 Viral infection, unspecified: Secondary | ICD-10-CM | POA: Insufficient documentation

## 2018-02-28 MED ORDER — DOXYCYCLINE HYCLATE 100 MG PO TABS
100.0000 mg | ORAL_TABLET | Freq: Once | ORAL | Status: AC
  Administered 2018-03-01: 100 mg via ORAL
  Filled 2018-02-28: qty 1

## 2018-02-28 MED ORDER — SODIUM CHLORIDE 0.9 % IV BOLUS
1000.0000 mL | Freq: Once | INTRAVENOUS | Status: AC
  Administered 2018-03-01: 1000 mL via INTRAVENOUS

## 2018-02-28 MED ORDER — ONDANSETRON HCL 4 MG PO TABS
4.0000 mg | ORAL_TABLET | Freq: Once | ORAL | Status: AC
  Administered 2018-03-01: 4 mg via ORAL
  Filled 2018-02-28: qty 1

## 2018-02-28 MED ORDER — IBUPROFEN 800 MG PO TABS
800.0000 mg | ORAL_TABLET | Freq: Once | ORAL | Status: AC
  Administered 2018-03-01: 800 mg via ORAL
  Filled 2018-02-28: qty 1

## 2018-02-28 NOTE — ED Triage Notes (Signed)
Patient also states he pulled a tick off of him that had a white spot on it.

## 2018-02-28 NOTE — ED Triage Notes (Signed)
Patient states he ate at [facility] [employer] earlier and then started to not feel well. Patient states he has generalized weakness with chills. Patient states that he feels nauseated but denies vomiting. Patient conscious, alert, and oriented.

## 2018-02-28 NOTE — ED Provider Notes (Signed)
[facility] EMERGENCY DEPARTMENT Provider Note   CSN: [id number] Arrival date & time: 02/28/18  2154     History   Chief Complaint Chief Complaint  Patient presents with  . Fever    Weakness    HPI Travis Mayer is a 32 y.o. male.  Patient is a 32 year old male who presents to the emergency department with a complaint of weakness and nausea.  Patient states that about 4 days ago he was eating at [facility] when he began to not feel well.  He began to have some stomach pain that he describes as sharp.  It first it was diffuse and then it was settled into his left lower abdomen.  The pain is been going on over the past 4days, waxing and waning.  The patient complains of sensation of weakness.  He has had some chills.  He has not measured a temperature elevation.  He has had problems with nausea that started today, but has not had any vomiting.  He has had loose stools recently.  The patient is also [place] encountered with ticks.  He says he pulled one off of him about a week ago.  He is not had any rash, he is not sure about fever, but says he has had chills and has been feeling weak.  He presents now for assistance with these issues.  The history is provided by the patient.  Fever   Associated symptoms include diarrhea. Pertinent negatives include no chest pain, no vomiting and no cough.    Past Medical History:  Diagnosis Date  . Kidney stone     There are no active problems to display for this patient.   Past Surgical History:  Procedure Laterality Date  . APPENDECTOMY          Home Medications    Prior to Admission medications   Not on File    Family History History reviewed. No pertinent family history.  Social History Social History   Tobacco Use  . Smoking status: Never Smoker  . Smokeless tobacco: Never Used  Substance Use Topics  . Alcohol use: No  . Drug use: No     Allergies   Patient has  no known allergies.   Review of Systems Review of Systems  Constitutional: Positive for activity change, appetite change and chills. Negative for fever.       All ROS Neg except as noted in HPI  HENT: Negative for nosebleeds.   Eyes: Negative for photophobia and discharge.  Respiratory: Negative for cough, shortness of breath and wheezing.   Cardiovascular: Negative for chest pain, palpitations and leg swelling.  Gastrointestinal: Positive for abdominal pain, diarrhea and nausea. Negative for blood in stool and vomiting.  Genitourinary: Negative for dysuria, frequency and hematuria.  Musculoskeletal: Negative for arthralgias, back pain and neck pain.  Skin: Negative.  Negative for rash.  Neurological: Positive for weakness. Negative for dizziness, seizures and speech difficulty.  Psychiatric/Behavioral: Negative for confusion and hallucinations.     Physical Exam Updated Vital Signs BP 127/83 (BP Location: Right Arm)   Pulse 74   Temp 98.5 F (36.9 C) (Oral)   Resp 18   Ht 5\' 11"  (1.803 m)   Wt 99.8 kg (220 lb)   SpO2 99%   BMI 30.68 kg/m   Physical Exam  Constitutional: He is oriented to person, place, and time. He appears well-developed and well-nourished.  Non-toxic appearance.  HENT:  Head:  Normocephalic.  Right Ear: Tympanic membrane and external ear normal.  Left Ear: Tympanic membrane and external ear normal.  Eyes: Pupils are equal, round, and reactive to light. EOM and lids are normal.  Neck: Normal range of motion. Neck supple. Carotid bruit is not present.  Cardiovascular: Normal rate, regular rhythm, normal heart sounds, intact distal pulses and normal pulses.  Pulmonary/Chest: Breath sounds normal. No respiratory distress.  Abdominal: Soft. Bowel sounds are normal. There is tenderness. There is no guarding.  Diffuse soreness.  Musculoskeletal: Normal range of motion.  Lymphadenopathy:       Head (right side): No submandibular adenopathy present.       Head  (left side): No submandibular adenopathy present.    He has no cervical adenopathy.  Neurological: He is alert and oriented to person, place, and time. He has normal strength. No cranial nerve deficit or sensory deficit. Coordination normal.  Skin: Skin is warm and dry. No rash noted.  Psychiatric: He has a normal mood and affect. His speech is normal.  Nursing note and vitals reviewed.    ED Treatments / Results  Labs (all labs ordered are listed, but only abnormal results are displayed) Labs Reviewed - No data to display  EKG None  Radiology No results found.  Procedures Procedures (including critical care time)  Medications Ordered in ED Medications  ondansetron (ZOFRAN) tablet 4 mg (has no administration in time range)  ibuprofen (ADVIL,MOTRIN) tablet 800 mg (has no administration in time range)  doxycycline (VIBRA-TABS) tablet 100 mg (has no administration in time range)  sodium chloride 0.9 % bolus 1,000 mL (has no administration in time range)     Initial Impression / Assessment and Plan / ED Course  I have reviewed the triage vital signs and the nursing notes.  Pertinent labs & imaging results that were available during my care of the patient were reviewed by me and considered in my medical decision making (see chart for details).       Final Clinical Impressions(s) / ED Diagnoses MDM  Vital signs are within normal limits.  Pulse oximetry is 99% on room air.  Differential at this time includes viral illness, food poisoning, early tickborne illness.  Doubt gallbladder problem, as patient has not had any right upper quadrant pain.  Doubt appendicitis, patient has not had any right lower quadrant or epigastric area pain.  He was able to eat a biscuit this morning.  Patient will receive IV fluids, Zofran.  Will begin doxycycline.  After fluids and zofran, pt improved. Rx given for zofran and doxycycline. Pt to follow up with PCP or return to the ED if any changes  or problem. Information on tick bites given to the patient.   Final diagnoses:  Viral illness  Tick bite, initial encounter    ED Discharge Orders        Ordered    doxycycline (VIBRAMYCIN) 100 MG capsule  2 times daily     03/01/18 0116    ondansetron (ZOFRAN) 4 MG tablet  Every 6 hours     03/01/18 0116       Ivery QualeBryant, [place] Ken[place], PA-C 03/04/18 1455    Geoffery Lyonselo, [place], MD 03/04/18 [phone]

## 2018-03-01 MED ORDER — DOXYCYCLINE HYCLATE 100 MG PO CAPS: 100.0000 mg | ORAL_CAPSULE | Freq: Two times a day (BID) | ORAL | 0 refills | Status: DC

## 2018-03-01 MED ORDER — ONDANSETRON HCL 4 MG PO TABS: 4.0000 mg | ORAL_TABLET | Freq: Four times a day (QID) | ORAL | 0 refills | Status: DC

## 2018-03-01 NOTE — Discharge Instructions (Addendum)
Your vital signs are within normal limits.  I suspect that your symptoms are viral related.  Please wash hands frequently.  Please increase fluids.  Use doxycycline 2 times daily because you have been exposed to ticks recently.  Use Zofran every4- 6 hours as needed for nausea.  Use Tylenol every 4 hours, or ibuprofen every 6 hours for any body aching or headache.  Please see your primary physician, or return to the emergency department if any changes in your condition, problems, or concerns.

## 2018-03-01 NOTE — ED Notes (Signed)
Patient states that he is so so. Asked patient if he was having pain at this time. States no.

## 2018-10-02 ENCOUNTER — Other Ambulatory Visit: Payer: Self-pay

## 2018-10-02 ENCOUNTER — Emergency Department (HOSPITAL_COMMUNITY)
Admission: EM | Admit: 2018-10-02 | Discharge: 2018-10-02 | Disposition: A | Discharge status: Home / Self Care | Payer: No Typology Code available for payment source | Attending: Emergency Medicine | Admitting: Emergency Medicine

## 2018-10-02 ENCOUNTER — Emergency Department (HOSPITAL_COMMUNITY): Payer: No Typology Code available for payment source

## 2018-10-02 ENCOUNTER — Encounter (HOSPITAL_COMMUNITY): Payer: Self-pay | Admitting: Emergency Medicine

## 2018-10-02 DIAGNOSIS — S3992XA Unspecified injury of lower back, initial encounter: Secondary | ICD-10-CM | POA: Diagnosis present

## 2018-10-02 DIAGNOSIS — Y999 Unspecified external cause status: Secondary | ICD-10-CM | POA: Diagnosis not present

## 2018-10-02 DIAGNOSIS — R079 Chest pain, unspecified: Secondary | ICD-10-CM

## 2018-10-02 DIAGNOSIS — Z79899 Other long term (current) drug therapy: Secondary | ICD-10-CM | POA: Diagnosis not present

## 2018-10-02 DIAGNOSIS — Y92410 Unspecified street and highway as the place of occurrence of the external cause: Secondary | ICD-10-CM | POA: Diagnosis not present

## 2018-10-02 DIAGNOSIS — Y939 Activity, unspecified: Secondary | ICD-10-CM | POA: Diagnosis not present

## 2018-10-02 DIAGNOSIS — S39012A Strain of muscle, fascia and tendon of lower back, initial encounter: Secondary | ICD-10-CM

## 2018-10-02 MED ORDER — CYCLOBENZAPRINE HCL 10 MG PO TABS: 10.0000 mg | ORAL_TABLET | Freq: Two times a day (BID) | ORAL | 0 refills | Status: DC | PRN

## 2018-10-02 MED ORDER — KETOROLAC TROMETHAMINE 60 MG/2ML IM SOLN
60.0000 mg | Freq: Once | INTRAMUSCULAR | Status: AC
  Administered 2018-10-02: 60 mg via INTRAMUSCULAR
  Filled 2018-10-02: qty 2

## 2018-10-02 MED ORDER — IBUPROFEN 600 MG PO TABS: 600.0000 mg | ORAL_TABLET | Freq: Four times a day (QID) | ORAL | 0 refills | Status: DC | PRN
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
## 2018-10-02 NOTE — Discharge Instructions (Signed)
Your x-rays today show that you have a possible lung nodule.  This was very faint, this may or may not be anything to be concerned about however you must have this followed up with your family doctor, I recommended follow-up chest x-ray within 1 month is at the minimum.  Please have your family doctor order this and follow-up the results.  If you do not have a family doctor see the notes below.  Your other x-rays show no injuries to the bones, the lungs or any other traumatic injuries.  This means your injuries are likely from the muscles and ligaments in your back.  You may take ibuprofen and Flexeril as prescribed to help with the symptoms.  Thank you for letting us take care of you today!  Please obtain all of your results from medical records or have your doctors office obtain the results - share them with your doctor - you should be seen at your doctors office in the next 2 days. Call today to arrange your follow up. Take the medications as prescribed. Please review all of the medicines and only take them if you do not have an allergy to them. Please be aware that if you are taking birth control pills, taking other prescriptions, ESPECIALLY ANTIBIOTICS may make the birth control ineffective - if this is the case, either do not engage in sexual activity or use alternative methods of birth control such as condoms until you have finished the medicine and your family doctor says it is OK to restart them. If you are on a blood thinner such as COUMADIN, be aware that any other medicine that you take may cause the coumadin to either work too much, or not enough - you should have your coumadin level rechecked in next 7 days if this is the case.  ?  It is also a possibility that you have an allergic reaction to any of the medicines that you have been prescribed - Everybody reacts differently to medications and while MOST people have no trouble with most medicines, you may have a reaction such as nausea,  vomiting, rash, swelling, shortness of breath. If this is the case, please stop taking the medicine immediately and contact your physician.   If you were given a medication in the ED such as percocet, vicodin, or morphine, be aware that these medicines are sedating and may change your ability to take care of yourself adequately for several hours after being given this medicines - you should not drive or take care of small children if you were given this medicine in the Emergency Department or if you have been prescribed these types of medicines. ?   You should return to the ER IMMEDIATELY if you develop severe or worsening symptoms.   [facility] Primary Care Doctor List    Travis Mayer. Specialty: Pulmonary Disease Contact information: [address]  [address]  [place] [address]  [phone]   Syliva Overman, Mayer. Specialty: [facility] Medicine Contact information: [address]  [place] [address]  [phone]   Lilyan Punt, Mayer. Specialty: [facility] Medicine Contact information: [address]  [place] [address]  [phone]   Avon Gully, Mayer Specialty: Internal Medicine Contact information: [address] [place] [address]  [phone]   Catalina Pizza, Mayer. Specialty: Internal Medicine Contact information: [address]  [place] [address]  [phone]    [facility] (Dr. Selena Batten) Specialty: Family Medicine Contact information: [phone]  [place] [address]  [phone]   John Giovanni, Mayer. Specialty: [facility] Medicine Contact information: [address]  [address]  [place] [address]  [phone]   Carylon Perches, Mayer. Specialty: Internal Medicine Contact information: [address]  [address]  [place] [address]  [phone]    [facility]  [address] [place], [place] [address] [phone]  Services [facility] offers a variety of basic health services.  Services include but are not limited to: Blood pressure checks  Heart rate checks  Blood sugar checks  Urine analysis  Rapid strep tests  Pregnancy tests.  Health education and referrals  People needing more complex services will be directed to a physician online. Using these virtual visits, doctors can evaluate and prescribe medicine and treatments. There will be no medication on-site, though [facility] will help patients fill their prescriptions at little to no cost.   For More information please go to: [email or web address]
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
## 2018-10-03 ENCOUNTER — Encounter (HOSPITAL_COMMUNITY): Payer: Self-pay | Admitting: Emergency Medicine

## 2018-10-03 ENCOUNTER — Emergency Department (HOSPITAL_COMMUNITY)
Admission: EM | Admit: 2018-10-03 | Discharge: 2018-10-03 | Disposition: A | Discharge status: Home / Self Care | Payer: Self-pay | Attending: Emergency Medicine | Admitting: Emergency Medicine

## 2018-10-03 ENCOUNTER — Other Ambulatory Visit: Payer: Self-pay

## 2018-10-03 DIAGNOSIS — R112 Nausea with vomiting, unspecified: Secondary | ICD-10-CM | POA: Insufficient documentation

## 2018-10-03 DIAGNOSIS — R197 Diarrhea, unspecified: Secondary | ICD-10-CM | POA: Insufficient documentation

## 2018-10-03 LAB — COMPREHENSIVE METABOLIC PANEL
ALT: 20 U/L (ref 0–44)
ANION GAP: 7 (ref 5–15)
AST: 23 U/L (ref 15–41)
Albumin: 4 g/dL (ref 3.5–5.0)
Alkaline Phosphatase: 58 U/L (ref 38–126)
BUN: 10 mg/dL (ref 6–20)
CO2: 25 mmol/L (ref 22–32)
Calcium: 9.2 mg/dL (ref 8.9–10.3)
Chloride: 103 mmol/L (ref 98–111)
Creatinine, Ser: 1.15 mg/dL (ref 0.61–1.24)
GFR calc Af Amer: >60 mL/min (ref >60)
GFR calc non Af Amer: >60 mL/min (ref >60)
GLUCOSE: 100 mg/dL — AB (ref 70–99)
Potassium: 4.1 mmol/L (ref 3.5–5.1)
Sodium: 135 mmol/L (ref 135–145)
Total Bilirubin: 0.8 mg/dL (ref 0.3–1.2)
Total Protein: 7.2 g/dL (ref 6.5–8.1)

## 2018-10-03 LAB — CBC WITH DIFFERENTIAL/PLATELET
Abs Immature Granulocytes: 0.02 10*3/uL (ref 0.00–0.07)
Basophils Absolute: 0 10*3/uL (ref 0.0–0.1)
Basophils Relative: 0 %
Eosinophils Absolute: 0 10*3/uL (ref 0.0–0.5)
Eosinophils Relative: 1 %
HCT: 48.2 % (ref 39.0–52.0)
Hemoglobin: 15.5 g/dL (ref 13.0–17.0)
Immature Granulocytes: 0 %
LYMPHS PCT: 15 %
Lymphs Abs: 1.1 10*3/uL (ref 0.7–4.0)
MCH: 29.9 pg (ref 26.0–34.0)
MCHC: 32.2 g/dL (ref 30.0–36.0)
MCV: 93.1 fL (ref 80.0–100.0)
Monocytes Absolute: 0.8 10*3/uL (ref 0.1–1.0)
Monocytes Relative: 11 %
Neutro Abs: 5.3 10*3/uL (ref 1.7–7.7)
Neutrophils Relative %: 73 %
Platelets: 205 10*3/uL (ref 150–400)
RBC: 5.18 MIL/uL (ref 4.22–5.81)
RDW: 12.5 % (ref 11.5–15.5)
WBC: 7.2 10*3/uL (ref 4.0–10.5)
nRBC: 0 % (ref 0.0–0.2)

## 2018-10-03 LAB — URINALYSIS, ROUTINE W REFLEX MICROSCOPIC
Bilirubin Urine: NEGATIVE
Glucose, UA: NEGATIVE mg/dL
Hgb urine dipstick: NEGATIVE
Ketones, ur: NEGATIVE mg/dL
Leukocytes, UA: NEGATIVE
Nitrite: NEGATIVE
Protein, ur: NEGATIVE mg/dL
Specific Gravity, Urine: 1.028 (ref 1.005–1.030)
pH: 6 (ref 5.0–8.0)

## 2018-10-03 LAB — LIPASE, BLOOD: Lipase: 28 U/L (ref 11–51)

## 2018-10-03 MED ORDER — ONDANSETRON HCL 4 MG/2ML IJ SOLN
4.0000 mg | Freq: Once | INTRAMUSCULAR | Status: AC
  Administered 2018-10-03: 4 mg via INTRAVENOUS
  Filled 2018-10-03: qty 2

## 2018-10-03 MED ORDER — ACETAMINOPHEN 500 MG PO TABS
1000.0000 mg | ORAL_TABLET | Freq: Once | ORAL | Status: AC
  Administered 2018-10-03: 1000 mg via ORAL
  Filled 2018-10-03: qty 2

## 2018-10-03 MED ORDER — FAMOTIDINE IN NACL 20-0.9 MG/50ML-% IV SOLN
20.0000 mg | Freq: Once | INTRAVENOUS | Status: AC
  Administered 2018-10-03: 20 mg via INTRAVENOUS
  Filled 2018-10-03: qty 50

## 2018-10-03 MED ORDER — KETOROLAC TROMETHAMINE 30 MG/ML IJ SOLN
30.0000 mg | Freq: Once | INTRAMUSCULAR | Status: AC
  Administered 2018-10-03: 30 mg via INTRAVENOUS
  Filled 2018-10-03: qty 1

## 2018-10-03 MED ORDER — SODIUM CHLORIDE 0.9 % IV BOLUS
1000.0000 mL | Freq: Once | INTRAVENOUS | Status: AC
  Administered 2018-10-03: 1000 mL via INTRAVENOUS

## 2018-10-03 MED ORDER — ONDANSETRON HCL 4 MG PO TABS: 4.0000 mg | ORAL_TABLET | Freq: Four times a day (QID) | ORAL | 0 refills | Status: DC

## 2018-10-03 NOTE — Discharge Instructions (Signed)
Stay well hydrated over the next few days.  Please follow up with your primary doctor within the next 5-7 days.  If you do not have a primary care provider, information for a healthcare clinic has been provided for you to make arrangements for follow up care. Please return to the ER sooner if you have any new or worsening symptoms, or if you have any of the following symptoms:  Abdominal pain that does not go away.  You have a fever.  You keep throwing up (vomiting).  The pain is felt only in portions of the abdomen. Pain in the right side could possibly be appendicitis. In an adult, pain in the left lower portion of the abdomen could be colitis or diverticulitis.  You pass bloody or black tarry stools.  There is bright red blood in the stool.  The constipation stays for more than 4 days.  There is belly (abdominal) or rectal pain.  You do not seem to be getting better.  You have any questions or concerns.

## 2018-10-03 NOTE — ED Triage Notes (Signed)
Pt state abd pain and diarrhea x 2 days
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
## 2018-10-03 NOTE — ED Notes (Addendum)
10/03/2018 @ 2140- Pt returned and states he didn't get his Rx for Zofran.  Spoke with PA Caccavale and states to call the Rx in.  Pt notified Rx called in to [facility] in [place] [phone] per pt's request.  Zofran 4mg  tablet Qty 8 with no refills.  [facility] currently closed- left message with charge RN number.

## 2018-10-04 ENCOUNTER — Emergency Department (HOSPITAL_COMMUNITY)
Admission: EM | Admit: 2018-10-04 | Discharge: 2018-10-04 | Disposition: A | Discharge status: Home / Self Care | Payer: Self-pay | Attending: Emergency Medicine | Admitting: Emergency Medicine

## 2018-10-04 ENCOUNTER — Emergency Department (HOSPITAL_COMMUNITY): Payer: Self-pay

## 2018-10-04 ENCOUNTER — Other Ambulatory Visit: Payer: Self-pay

## 2018-10-04 ENCOUNTER — Encounter (HOSPITAL_COMMUNITY): Payer: Self-pay | Admitting: Emergency Medicine

## 2018-10-04 DIAGNOSIS — R197 Diarrhea, unspecified: Secondary | ICD-10-CM | POA: Insufficient documentation

## 2018-10-04 DIAGNOSIS — R112 Nausea with vomiting, unspecified: Secondary | ICD-10-CM | POA: Insufficient documentation

## 2018-10-04 DIAGNOSIS — R1010 Upper abdominal pain, unspecified: Secondary | ICD-10-CM

## 2018-10-04 DIAGNOSIS — R1013 Epigastric pain: Secondary | ICD-10-CM | POA: Insufficient documentation

## 2018-10-04 DIAGNOSIS — R1033 Periumbilical pain: Secondary | ICD-10-CM | POA: Insufficient documentation

## 2018-10-04 LAB — CBC WITH DIFFERENTIAL/PLATELET
Abs Immature Granulocytes: 0.01 10*3/uL (ref 0.00–0.07)
Basophils Absolute: 0 10*3/uL (ref 0.0–0.1)
Basophils Relative: 0 %
EOS PCT: 2 %
Eosinophils Absolute: 0.1 10*3/uL (ref 0.0–0.5)
HCT: 43.5 % (ref 39.0–52.0)
Hemoglobin: 14.2 g/dL (ref 13.0–17.0)
Immature Granulocytes: 0 %
Lymphocytes Relative: 12 %
Lymphs Abs: 0.9 10*3/uL (ref 0.7–4.0)
MCH: 30.2 pg (ref 26.0–34.0)
MCHC: 32.6 g/dL (ref 30.0–36.0)
MCV: 92.6 fL (ref 80.0–100.0)
Monocytes Absolute: 0.9 10*3/uL (ref 0.1–1.0)
Monocytes Relative: 12 %
Neutro Abs: 5.5 10*3/uL (ref 1.7–7.7)
Neutrophils Relative %: 74 %
Platelets: 196 10*3/uL (ref 150–400)
RBC: 4.7 MIL/uL (ref 4.22–5.81)
RDW: 12.9 % (ref 11.5–15.5)
WBC: 7.4 10*3/uL (ref 4.0–10.5)
nRBC: 0 % (ref 0.0–0.2)

## 2018-10-04 LAB — COMPREHENSIVE METABOLIC PANEL
ALT: 17 U/L (ref 0–44)
AST: 16 U/L (ref 15–41)
Albumin: 3.6 g/dL (ref 3.5–5.0)
Alkaline Phosphatase: 46 U/L (ref 38–126)
Anion gap: 6 (ref 5–15)
BUN: 13 mg/dL (ref 6–20)
CO2: 24 mmol/L (ref 22–32)
Calcium: 8.7 mg/dL — ABNORMAL LOW (ref 8.9–10.3)
Chloride: 106 mmol/L (ref 98–111)
Creatinine, Ser: 0.96 mg/dL (ref 0.61–1.24)
GFR calc Af Amer: >60 mL/min (ref >60)
Glucose, Bld: 101 mg/dL — ABNORMAL HIGH (ref 70–99)
POTASSIUM: 4.2 mmol/L (ref 3.5–5.1)
Sodium: 136 mmol/L (ref 135–145)
Total Bilirubin: 0.4 mg/dL (ref 0.3–1.2)
Total Protein: 6.6 g/dL (ref 6.5–8.1)

## 2018-10-04 LAB — LIPASE, BLOOD: Lipase: 22 U/L (ref 11–51)

## 2018-10-04 MED ORDER — OMEPRAZOLE 20 MG PO CPDR: 20.0000 mg | DELAYED_RELEASE_CAPSULE | Freq: Every day | ORAL | 0 refills | Status: DC

## 2018-10-04 MED ORDER — PANTOPRAZOLE SODIUM 40 MG IV SOLR
40.0000 mg | Freq: Once | INTRAVENOUS | Status: AC
  Administered 2018-10-04: 40 mg via INTRAVENOUS
  Filled 2018-10-04: qty 40

## 2018-10-04 MED ORDER — SODIUM CHLORIDE 0.9 % IV BOLUS
1000.0000 mL | Freq: Once | INTRAVENOUS | Status: AC
  Administered 2018-10-04: 1000 mL via INTRAVENOUS

## 2018-10-04 MED ORDER — HYDROCODONE-ACETAMINOPHEN 5-325 MG PO TABS: ORAL_TABLET | ORAL | 0 refills | Status: DC

## 2018-10-04 NOTE — Discharge Instructions (Addendum)
Frequent small sips of clear fluids today, then bland diet as tolerated starting tomorrow.  Take your medication as directed.  Follow-up with your primary doctor for recheck.

## 2018-10-04 NOTE — ED Triage Notes (Signed)
Patient complains of abdominal pain x 3 days. Patient seen for same yesterday at [facility] and diagnosed with kidney stones. Patient states he is still in pain and wants to be re-evaluated.

## 2018-10-04 NOTE — ED Provider Notes (Signed)
[facility] EMERGENCY DEPARTMENT Provider Note   CSN: [id number] Arrival date & time: 10/04/18  [id number]     History   Chief Complaint Chief Complaint  Patient presents with  . Abdominal Pain    HPI Travis Mayer is a 33 y.o. male.  HPI   Travis Mayer is a 33 y.o. male who presents to the Emergency Department complaining of persistent epigastric and mid abdominal pain, vomiting, and diarrhea.  He was seen at [facility] ER yesterday for similar symptoms.  He states that his symptoms returned shortly after ER discharge.  He states he is able to tolerate only small amounts of fluids or bland food.  He describes the pain to his abdomen as worse near his umbilicus and radiates to his epigastric area.  No back or flank pain.  Pain is also associated with lying on either side.  He denies fever, chills, bloody or black stools, recent antibiotics and dysuria.  He has history of appendectomy "years ago"   Past Medical History:  Diagnosis Date  . Kidney stone     There are no active problems to display for this patient.   Past Surgical History:  Procedure Laterality Date  . APPENDECTOMY       Home Medications    Prior to Admission medications   Medication Sig Start Date End Date Taking? Authorizing Provider  cyclobenzaprine (FLEXERIL) 10 MG tablet Take 1 tablet (10 mg total) by mouth 2 (two) times daily as needed for muscle spasms. 10/02/18   Eber HongMiller, Brian, MD  doxycycline (VIBRAMYCIN) 100 MG capsule Take 1 capsule (100 mg total) by mouth 2 (two) times daily. 03/01/18   Ivery QualeBryant, Hobson, PA-C  ibuprofen (ADVIL,MOTRIN) 600 MG tablet Take 1 tablet (600 mg total) by mouth every 6 (six) hours as needed. 10/02/18   Eber HongMiller, Brian, MD  ondansetron (ZOFRAN) 4 MG tablet Take 1 tablet (4 mg total) by mouth every 6 (six) hours. 10/03/18   Couture, Cortni S, PA-C    Family History No family history on file.  Social History Social History   Tobacco Use  . Smoking status: Never Smoker  .  Smokeless tobacco: Never Used  Substance Use Topics  . Alcohol use: No  . Drug use: No     Allergies   Patient has no known allergies.   Review of Systems Review of Systems  Constitutional: Positive for appetite change. Negative for chills and fever.  Respiratory: Negative for chest tightness and shortness of breath.   Cardiovascular: Negative for chest pain.  Gastrointestinal: Positive for abdominal pain, diarrhea, nausea and vomiting. Negative for abdominal distention and blood in stool.  Genitourinary: Negative for decreased urine volume, difficulty urinating, dysuria and flank pain.  Musculoskeletal: Negative for back pain.  Skin: Negative for color change and rash.  Neurological: Negative for dizziness, weakness and numbness.  Hematological: Negative for adenopathy.     Physical Exam Updated Vital Signs BP (!) 145/97 (BP Location: Left Arm)   Pulse 92   Temp 98.6 F (37 C) (Oral)   Resp 18   Ht (P) 5\' 10"  (1.778 m)   Wt (P) 104.3 kg   SpO2 96%   BMI (P) 32.99 kg/m   Physical Exam Vitals signs and nursing note reviewed.  Constitutional:      General: He is not in acute distress.    Appearance: He is well-developed.  HENT:     Head: Normocephalic and atraumatic.     Mouth/Throat:     Pharynx: No posterior  oropharyngeal erythema.     Comments: Mucous membranes are slightly dry. Eyes:     Conjunctiva/sclera: Conjunctivae normal.  Neck:     Musculoskeletal: Normal range of motion.  Cardiovascular:     Rate and Rhythm: Normal rate and regular rhythm.     Pulses: Normal pulses.     Heart sounds: No murmur.  Pulmonary:     Effort: Pulmonary effort is normal. No respiratory distress.     Breath sounds: Normal breath sounds.  Abdominal:     General: Bowel sounds are normal. There is no distension.     Palpations: Abdomen is soft. There is no mass.     Tenderness: There is abdominal tenderness in the epigastric area and periumbilical area. There is no guarding  or rebound.     Comments: Mild tenderness to palpation of the periumbilical and epigastric areas.  No guarding or rebound tenderness.  Abdomen is soft.  Musculoskeletal: Normal range of motion.  Skin:    General: Skin is warm and dry.     Capillary Refill: Capillary refill takes less than 2 seconds.  Neurological:     Mental Status: He is alert and oriented to person, place, and time.     Motor: No abnormal muscle tone.     Coordination: Coordination normal.      ED Treatments / Results  Labs (all labs ordered are listed, but only abnormal results are displayed) Labs Reviewed  COMPREHENSIVE METABOLIC PANEL - Abnormal; Notable for the following components:      Result Value   Glucose, Bld 101 (*)    Calcium 8.7 (*)    All other components within normal limits  CBC WITH DIFFERENTIAL/PLATELET  LIPASE, BLOOD    EKG None  Radiology  [place] Abdomen Limited  Result Date: 10/04/2018 CLINICAL DATA:  Acute epigastric abdominal pain. EXAM: ULTRASOUND ABDOMEN LIMITED RIGHT UPPER QUADRANT COMPARISON:  CT scan of June 14, 2016. FINDINGS: Gallbladder: No gallstones or wall thickening visualized. No sonographic Murphy sign noted by sonographer. Common bile duct: Diameter: 5 mm which is within normal limits. Liver: No focal lesion identified. Within normal limits in parenchymal echogenicity. Portal vein is patent on color Doppler imaging with normal direction of blood flow towards the liver. IMPRESSION: No abnormality seen in the right upper quadrant of the abdomen. Electronically Signed   By: Lupita RaiderJames  Green Jr, M.D.   On: 10/04/2018 10:38    Procedures Procedures (including critical care time)  Medications Ordered in ED Medications  sodium chloride 0.9 % bolus 1,000 mL (has no administration in time range)  pantoprazole (PROTONIX) injection 40 mg (has no administration in time range)     Initial Impression / Assessment and Plan / ED Course  I have reviewed the triage vital signs and  the nursing notes.  Pertinent labs & imaging results that were available during my care of the patient were reviewed by me and considered in my medical decision making (see chart for details).     Patient returns to this ER after being seen at [facility] yesterday.  He continues to have diffuse abdominal pain from the periumbilical to epigastric regions.  His abdomen is soft without significant tenderness on exam.  I doubt this is an acute surgical abdomen.  Work-up today is reassuring.  He is feeling better after IV Protonix he has been resting comfortably and tolerating small amounts of fluids.  No vomiting or diarrhea during his ER visit.  Ultrasound today does not show evidence for cholelithiasis or cholecystitis.  I feel that his symptoms are likely related to a viral process.  He appears appropriate for discharge home, agrees to treatment plan with Protonix daily, bland diet and clear fluids.  He has prescription for Zofran from his previous visit.  Return precautions were discussed.  Final Clinical Impressions(s) / ED Diagnoses   Final diagnoses:  Pain of upper abdomen  Nausea vomiting and diarrhea    ED Discharge Orders    None       Pauline Aus, PA-C 10/04/18 1120    Bethann Berkshire, MD 10/04/18 1351

## 2021-02-10 ENCOUNTER — Encounter (HOSPITAL_COMMUNITY): Payer: Self-pay

## 2021-02-10 ENCOUNTER — Emergency Department (HOSPITAL_COMMUNITY)
Admission: EM | Admit: 2021-02-10 | Discharge: 2021-02-10 | Disposition: A | Discharge status: Home / Self Care | Payer: Self-pay | Attending: Emergency Medicine | Admitting: Emergency Medicine

## 2021-02-10 ENCOUNTER — Other Ambulatory Visit: Payer: Self-pay

## 2021-02-10 ENCOUNTER — Emergency Department (HOSPITAL_COMMUNITY): Payer: Self-pay

## 2021-02-10 DIAGNOSIS — R0789 Other chest pain: Secondary | ICD-10-CM | POA: Insufficient documentation

## 2021-02-10 DIAGNOSIS — Z79899 Other long term (current) drug therapy: Secondary | ICD-10-CM | POA: Insufficient documentation

## 2021-02-10 DIAGNOSIS — M79605 Pain in left leg: Secondary | ICD-10-CM | POA: Insufficient documentation

## 2021-02-10 LAB — CBC WITH DIFFERENTIAL/PLATELET
Abs Immature Granulocytes: 0.01 10*3/uL (ref 0.00–0.07)
Basophils Absolute: 0 10*3/uL (ref 0.0–0.1)
Basophils Relative: 0 %
Eosinophils Absolute: 0.1 10*3/uL (ref 0.0–0.5)
Eosinophils Relative: 1 %
HCT: 44 % (ref 39.0–52.0)
Hemoglobin: 14.7 g/dL (ref 13.0–17.0)
Immature Granulocytes: 0 %
Lymphocytes Relative: 32 %
Lymphs Abs: 2.3 10*3/uL (ref 0.7–4.0)
MCH: 31.3 pg (ref 26.0–34.0)
MCHC: 33.4 g/dL (ref 30.0–36.0)
MCV: 93.8 fL (ref 80.0–100.0)
Monocytes Absolute: 0.8 10*3/uL (ref 0.1–1.0)
Monocytes Relative: 11 %
Neutro Abs: 4 10*3/uL (ref 1.7–7.7)
Neutrophils Relative %: 56 %
Platelets: 226 10*3/uL (ref 150–400)
RBC: 4.69 MIL/uL (ref 4.22–5.81)
RDW: 12.7 % (ref 11.5–15.5)
WBC: 7.3 10*3/uL (ref 4.0–10.5)
nRBC: 0 % (ref 0.0–0.2)

## 2021-02-10 LAB — BASIC METABOLIC PANEL
Anion gap: 7 (ref 5–15)
BUN: 19 mg/dL (ref 6–20)
CO2: 27 mmol/L (ref 22–32)
Calcium: 9.2 mg/dL (ref 8.9–10.3)
Chloride: 107 mmol/L (ref 98–111)
Creatinine, Ser: 1.23 mg/dL (ref 0.61–1.24)
GFR, Estimated: >60 mL/min (ref >60)
Glucose, Bld: 81 mg/dL (ref 70–99)
Potassium: 4.2 mmol/L (ref 3.5–5.1)
Sodium: 141 mmol/L (ref 135–145)

## 2021-02-10 LAB — TROPONIN I (HIGH SENSITIVITY): Troponin I (High Sensitivity): 2 ng/L (ref <18)

## 2021-02-10 MED ORDER — METHOCARBAMOL 500 MG PO TABS: 500.0000 mg | ORAL_TABLET | Freq: Two times a day (BID) | ORAL | 0 refills | Status: DC

## 2021-02-10 MED ORDER — METHOCARBAMOL 500 MG PO TABS
500.0000 mg | ORAL_TABLET | Freq: Once | ORAL | Status: AC
  Administered 2021-02-10: 500 mg via ORAL
  Filled 2021-02-10: qty 1

## 2021-02-10 MED ORDER — MELOXICAM 7.5 MG PO TABS: 7.5000 mg | ORAL_TABLET | Freq: Every day | ORAL | 0 refills | Status: DC

## 2021-02-10 MED ORDER — ACETAMINOPHEN 325 MG PO TABS
650.0000 mg | ORAL_TABLET | Freq: Once | ORAL | Status: AC
  Administered 2021-02-10: 650 mg via ORAL
  Filled 2021-02-10: qty 2

## 2021-02-10 NOTE — ED Triage Notes (Signed)
Pt arrived via POV c/o chest pain and left leg numbness X 3 days. 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

## 2021-02-10 NOTE — Discharge Instructions (Signed)
Your work-up today was reassuring.  I suspect that your symptoms today are related to muscular pain or something called costochondritis.  However if you are experiencing any new or worsening symptoms you may always return to the ER for reevaluation.  Please drink plenty of water, apply icy hot to your chest, to gentle massage and stretching, warm compresses and take Tylenol and ibuprofen as discussed below.  I have also prescribed you Mobic you may use this instead of ibuprofen if you wish because it is once daily instead of three times daily.  Please use Tylenol or ibuprofen for pain.  You may use 600 mg ibuprofen every 6 hours or 1000 mg of Tylenol every 6 hours.  You may choose to alternate between the 2.  This would be most effective.  Not to exceed 4 g of Tylenol within 24 hours.  Not to exceed 3200 mg ibuprofen 24 hours.   Please use muscle relaxer Robaxin as needed.  Please use this only at home and not when you any operating any machinery or at work or driving.

## 2021-02-10 NOTE — ED Provider Notes (Signed)
[facility] EMERGENCY DEPARTMENT Provider Note   CSN: [id number] Arrival date & time: 02/10/21  1858     History Chief Complaint  Patient presents with  . Chest Pain    Travis Mayer is a 35 y.o. male.  HPI  Patient is a 35 year old male with past medical history significant for kidney stones presenting today with left leg pain that is burning tingling along with chest pressure for 3 days.  He states that the chest pressure is constant over the past 3 days.  He states it is worse when he sneezes or coughs.  He denies any shortness of breath lightheadedness or dizziness.  He denies any abdominal pain nausea or vomiting.  He states that he works in [employer] and was concerned that he may have pulled a muscle.  He states that the only aggravating factor is it is worse when he is moving around or rotating his chest or picking things up or pushing objects away from him.  He denies any other aggravating factors.  He states it is not worse with walking or exertion.  Does not seem to be chronologically tied to eating.  No hemoptysis.  No recent surgeries, hospitalization, long travel, hemoptysis, estrogen containing OCP, cancer history.  No unilateral leg swelling.  No history of PE or VTE.   He states he has been eating and drinking normally.  Denies any recreational drug use.  Only occasional alcohol use.     Past Medical History:  Diagnosis Date  . Kidney stone     There are no problems to display for this patient.   Past Surgical History:  Procedure Laterality Date  . APPENDECTOMY         History reviewed. No pertinent family history.  Social History   Tobacco Use  . Smoking status: Never Smoker  . Smokeless tobacco: Never Used  Vaping Use  . Vaping Use: Never used  Substance Use Topics  . Alcohol use: No  . Drug use: No    Home Medications Prior to Admission medications   Medication Sig Start Date End Date Taking? Authorizing Provider  meloxicam  (MOBIC) 7.5 MG tablet Take 1 tablet (7.5 mg total) by mouth daily. 02/10/21  Yes Antino Mayabb S, PA  methocarbamol (ROBAXIN) 500 MG tablet Take 1 tablet (500 mg total) by mouth 2 (two) times daily. 02/10/21  Yes Bianka Liberati S, PA  cyclobenzaprine (FLEXERIL) 10 MG tablet Take 1 tablet (10 mg total) by mouth 2 (two) times daily as needed for muscle spasms. Patient not taking: Reported on 02/10/2021 10/02/18   Eber Hong, MD  doxycycline (VIBRAMYCIN) 100 MG capsule Take 1 capsule (100 mg total) by mouth 2 (two) times daily. Patient not taking: Reported on 02/10/2021 03/01/18   Ivery Quale, PA-C  HYDROcodone-acetaminophen (NORCO/VICODIN) 5-325 MG tablet Take one tab po q 4 hrs prn pain Patient not taking: Reported on 02/10/2021 10/04/18   Pauline Aus, PA-C  ibuprofen (ADVIL,MOTRIN) 600 MG tablet Take 1 tablet (600 mg total) by mouth every 6 (six) hours as needed. Patient not taking: Reported on 02/10/2021 10/02/18   Eber Hong, MD  omeprazole (PRILOSEC) 20 MG capsule Take 1 capsule (20 mg total) by mouth daily. Patient not taking: Reported on 02/10/2021 10/04/18   Triplett, Tammy, PA-C  ondansetron (ZOFRAN) 4 MG tablet Take 1 tablet (4 mg total) by mouth every 6 (six) hours. Patient not taking: Reported on 02/10/2021 10/03/18   Karrie Meres, PA-C    Allergies    Patient  has no known allergies.  Review of Systems   Review of Systems  Constitutional: Negative for chills and fever.  HENT: Negative for congestion.   Eyes: Negative for pain.  Respiratory: Negative for cough and shortness of breath.   Cardiovascular: Positive for chest pain (chest pressure). Negative for leg swelling.  Gastrointestinal: Negative for abdominal pain, diarrhea, nausea and vomiting.  Genitourinary: Negative for dysuria.  Musculoskeletal: Negative for myalgias.       Left leg pain/paresthesias   Skin: Negative for rash.  Neurological: Negative for dizziness and headaches.    Physical Exam Updated Vital  Signs BP 129/77   Pulse 85   Temp 98.1 F (36.7 C) (Oral)   Resp 20   Ht 5\' 10"  (1.778 m)   Wt 113.4 kg   SpO2 96%   BMI 35.87 kg/m   Physical Exam Vitals and nursing note reviewed.  Constitutional:      General: He is not in acute distress. HENT:     Head: Normocephalic and atraumatic.     Nose: Nose normal.     Mouth/Throat:     Mouth: Mucous membranes are moist.  Eyes:     General: No scleral icterus. Cardiovascular:     Rate and Rhythm: Normal rate and regular rhythm.     Pulses: Normal pulses.     Heart sounds: Normal heart sounds.  Pulmonary:     Effort: Pulmonary effort is normal. No respiratory distress.     Breath sounds: No wheezing.     Comments: Tenderness to palpation of the left pectoral muscle and midline sternum.  This reproduces patient's pain. Chest:     Chest wall: Tenderness present.  Abdominal:     Palpations: Abdomen is soft.     Tenderness: There is no abdominal tenderness. There is no guarding or rebound.  Musculoskeletal:     Cervical back: Normal range of motion.     Right lower leg: No edema.     Left lower leg: No edema.     Comments: No lower extremity edema or calf tenderness.  No swelling.  Skin:    General: Skin is warm and dry.     Capillary Refill: Capillary refill takes less than 2 seconds.  Neurological:     Mental Status: He is alert. Mental status is at baseline.     Comments: Good sensation and touch in bilateral lower extremities across the thigh, moves all 4 extremities without difficulty and good strength and coordination.  Ambulatory.  Normal gait.  Psychiatric:        Mood and Affect: Mood normal.        Behavior: Behavior normal.     ED Results / Procedures / Treatments   Labs (all labs ordered are listed, but only abnormal results are displayed) Labs Reviewed  BASIC METABOLIC PANEL  CBC WITH DIFFERENTIAL/PLATELET  TROPONIN I (HIGH SENSITIVITY)    EKG None  Radiology DG Chest 2 View  Result Date:  02/10/2021 CLINICAL DATA:  Chest pain x3 days. EXAM: CHEST - 2 VIEW COMPARISON:  October 02, 2018 FINDINGS: The heart size and mediastinal contours are within normal limits. Both lungs are clear. The visualized skeletal structures are unremarkable. IMPRESSION: No active cardiopulmonary disease. Electronically Signed   By: October 04, 2018 M.D.   On: 02/10/2021 20:14    Procedures Procedures   Medications Ordered in ED Medications  methocarbamol (ROBAXIN) tablet 500 mg (500 mg Oral Given 02/10/21 2035)  acetaminophen (TYLENOL) tablet 650 mg (650 mg Oral Given  02/10/21 2035)    ED Course  I have reviewed the triage vital signs and the nursing notes.  Pertinent labs & imaging results that were available during my care of the patient were reviewed by me and considered in my medical decision making (see chart for details).    MDM Rules/Calculators/A&P                          Patient is overall well-appearing 35 year old male he complains of severe chest pain but with further questioning indicates that this is more of a discomfort.  He states that his symptoms been ongoing for the past 3 days.  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  He states that he will occasionally strain muscles and he feels that he did this in his left leg.  He states he has had some burning pain in his left leg denies any numbness although he originally describes this as numbness it is more of a burning sensation.  He states it hurts more when he moves it specifically stretching his leg by flexing at the hip.  He has some pain in his left buttocks but no significant tenderness palpation here.  His anterior sternal chest pain is reproducible palpation with gentle palpation with percussion of the sternum.  This does indicate possibility of costochondritis.  Will obtain basic lab work including troponin although I have very low suspicion for ACS.  Doubt PE as patient denies pleuritic component of CP, denies history of clot,  active cancer, immobilization, surgery, hemoptysis, known clotting disorder, is on no estrogen containing medications, denies calf pain and on physical exam has no unilateral leg swelling, calf TTP, tachycardia or hypoxia.   Doubt ACS as patient is young and apart from being somewhat obese no significant risk factors.  Patient's CP is atypical for ACS. Is non-radiating, substernal, not described as chest pressure and is not precipitated/worsened by exertion.  Doubt thoracic aortic dissection as pain lacks tearing or severe quality and does not radiate to back. Patient denies nausea, diaphoresis. Symmetric pulses, no murmur, no neurologic deficit given that his leg numbness is actually a burning sensation and has good sensation on my examination.  Doubt pericarditis as no recent illness, PE without muffled heart sounds or friction rub, CP is non-pleuritic, EKG without global STE or PR depression.  Troponin x1 within normal limits CBC without leukocytosis or anemia BMP unremarkable.  Chest x-ray without any acute abnormality and EKG with normal sinus rhythm no significant ST-T wave abnormalities apart from some inferior TWI. EKG reviewed by attending physician.  Patient given Tylenol Robaxin with some improvement.  Will discharge home with same.  Return precautions given.  Final Clinical Impression(s) / ED Diagnoses Final diagnoses:  Atypical chest pain  Chest wall pain    Rx / DC Orders ED Discharge Orders         Ordered    meloxicam (MOBIC) 7.5 MG tablet  Daily        02/10/21 2122    methocarbamol (ROBAXIN) 500 MG tablet  2 times daily        02/10/21 2122           Gailen Shelter, [place] 02/11/21 2319    Bethann Berkshire, MD 02/12/21 1101

## 2021-05-12 ENCOUNTER — Emergency Department (HOSPITAL_COMMUNITY)
Admission: EM | Admit: 2021-05-12 | Discharge: 2021-05-12 | Disposition: A | Discharge status: Home / Self Care | Payer: Self-pay | Attending: Emergency Medicine | Admitting: Emergency Medicine

## 2021-05-12 ENCOUNTER — Encounter (HOSPITAL_COMMUNITY): Payer: Self-pay

## 2021-05-12 ENCOUNTER — Other Ambulatory Visit: Payer: Self-pay

## 2021-05-12 ENCOUNTER — Emergency Department (HOSPITAL_COMMUNITY): Payer: Self-pay

## 2021-05-12 DIAGNOSIS — Z23 Encounter for immunization: Secondary | ICD-10-CM | POA: Insufficient documentation

## 2021-05-12 DIAGNOSIS — Y9389 Activity, other specified: Secondary | ICD-10-CM | POA: Insufficient documentation

## 2021-05-12 DIAGNOSIS — S61211A Laceration without foreign body of left index finger without damage to nail, initial encounter: Secondary | ICD-10-CM | POA: Insufficient documentation

## 2021-05-12 DIAGNOSIS — W312XXA Contact with powered woodworking and forming machines, initial encounter: Secondary | ICD-10-CM | POA: Insufficient documentation

## 2021-05-12 MED ORDER — TETANUS-DIPHTH-ACELL PERTUSSIS 5-2.5-18.5 LF-MCG/0.5 IM SUSY
0.5000 mL | PREFILLED_SYRINGE | Freq: Once | INTRAMUSCULAR | Status: AC
  Administered 2021-05-12: 0.5 mL via INTRAMUSCULAR
  Filled 2021-05-12: qty 0.5

## 2021-05-12 MED ORDER — OXYCODONE-ACETAMINOPHEN 5-325 MG PO TABS: 1.0000 | ORAL_TABLET | Freq: Three times a day (TID) | ORAL | 0 refills | Status: AC | PRN

## 2021-05-12 MED ORDER — LIDOCAINE HCL (PF) 1 % IJ SOLN
5.0000 mL | Freq: Once | INTRAMUSCULAR | Status: AC
  Administered 2021-05-12: 5 mL
  Filled 2021-05-12: qty 30

## 2021-05-12 MED ORDER — CEPHALEXIN 500 MG PO CAPS: 500.0000 mg | ORAL_CAPSULE | Freq: Once | ORAL | Status: DC

## 2021-05-12 MED ORDER — CEPHALEXIN 500 MG PO CAPS: 500.0000 mg | ORAL_CAPSULE | Freq: Four times a day (QID) | ORAL | 0 refills | Status: AC

## 2021-05-12 NOTE — Discharge Instructions (Addendum)
You have received 6 sutures, please keep the wound dry for the first 24 hours, after that I would like you to rinse out the wound and change the dressings 2 times daily.  I have started you on antibiotics please take as prescribed.  Also place in a splint do not remove.I have given you a short course of narcotics please take as prescribed.  This medication can make you drowsy do not consume alcohol or operate heavy machinery when taking this medication.  This medication is Tylenol in it do not take Tylenol and take this medication.  I want you to follow-up with hand surgery as.  You might have injured your tendon.  Given contact info above please call  Come back to the emergency department if you develop chest pain, shortness of breath, severe abdominal pain, uncontrolled nausea, vomiting, diarrhea.

## 2021-05-12 NOTE — ED Provider Notes (Signed)
[facility] EMERGENCY DEPARTMENT Provider Note   CSN: [id number] Arrival date & time: 05/12/21  1940     History Chief Complaint  Patient presents with   Laceration    Travis Mayer is a 35 y.o. male.  HPI  Patient with no significant medical history presents with chief complaint of a laceration on the knuckle of his left index finger.  Patient states this happened around 7 PM today, he was using a circular saw and unfortunately nicked his left hand against it.  He states he is able to control bleeding with direct pressure, denies paresthesias or weakness in his left index finger.  He is not immunocompromise, does not remember the last time he had a tetanus shot.  Patient is right-handed.  He has no other complaints at this time.  Does not endorse headaches, fevers, chills, shortness breath or chest pain.  Past Medical History:  Diagnosis Date   Kidney stone     There are no problems to display for this patient.   Past Surgical History:  Procedure Laterality Date   APPENDECTOMY         History reviewed. No pertinent family history.  Social History   Tobacco Use   Smoking status: Never   Smokeless tobacco: Never  Vaping Use   Vaping Use: Never used  Substance Use Topics   Alcohol use: No   Drug use: No    Home Medications Prior to Admission medications   Medication Sig Start Date End Date Taking? Authorizing Provider  cephALEXin (KEFLEX) 500 MG capsule Take 1 capsule (500 mg total) by mouth 4 (four) times daily for 7 days. 05/12/21 05/19/21 Yes Carroll Sage, PA-C  oxyCODONE-acetaminophen (PERCOCET/ROXICET) 5-325 MG tablet Take 1 tablet by mouth every 8 (eight) hours as needed for up to 2 days for severe pain. 05/12/21 05/14/21 Yes Carroll Sage, PA-C  oxyCODONE-acetaminophen (PERCOCET/ROXICET) 5-325 MG tablet Take 1 tablet by mouth every 8 (eight) hours as needed for up to 2 days for severe pain. 05/12/21 05/14/21 Yes Carroll Sage, PA-C   cyclobenzaprine (FLEXERIL) 10 MG tablet Take 1 tablet (10 mg total) by mouth 2 (two) times daily as needed for muscle spasms. Patient not taking: Reported on 02/10/2021 10/02/18   Eber Hong, MD  doxycycline (VIBRAMYCIN) 100 MG capsule Take 1 capsule (100 mg total) by mouth 2 (two) times daily. Patient not taking: Reported on 02/10/2021 03/01/18   Ivery Quale, PA-C  HYDROcodone-acetaminophen (NORCO/VICODIN) 5-325 MG tablet Take one tab po q 4 hrs prn pain Patient not taking: Reported on 02/10/2021 10/04/18   Pauline Aus, PA-C  ibuprofen (ADVIL,MOTRIN) 600 MG tablet Take 1 tablet (600 mg total) by mouth every 6 (six) hours as needed. Patient not taking: Reported on 02/10/2021 10/02/18   Eber Hong, MD  meloxicam (MOBIC) 7.5 MG tablet Take 1 tablet (7.5 mg total) by mouth daily. 02/10/21   Gailen Shelter, PA  methocarbamol (ROBAXIN) 500 MG tablet Take 1 tablet (500 mg total) by mouth 2 (two) times daily. 02/10/21   Gailen Shelter, PA  omeprazole (PRILOSEC) 20 MG capsule Take 1 capsule (20 mg total) by mouth daily. Patient not taking: Reported on 02/10/2021 10/04/18   Triplett, Tammy, PA-C  ondansetron (ZOFRAN) 4 MG tablet Take 1 tablet (4 mg total) by mouth every 6 (six) hours. Patient not taking: Reported on 02/10/2021 10/03/18   Couture, Cortni S, PA-C    Allergies    Patient has no known allergies.  Review of Systems  Review of Systems  Constitutional:  Negative for chills and fever.  HENT:  Negative for congestion.   Respiratory:  Negative for shortness of breath.   Cardiovascular:  Negative for chest pain.  Gastrointestinal:  Negative for abdominal pain.  Genitourinary:  Negative for enuresis.  Musculoskeletal:  Negative for back pain.  Skin:  Positive for wound. Negative for rash.  Neurological:  Negative for dizziness.  Hematological:  Does not bruise/bleed easily.   Physical Exam Updated Vital Signs BP (!) 154/92 (BP Location: Right Arm)   Pulse 85   Temp 98.2 F (36.8  C) (Oral)   Resp 18   Ht 5\' 10"  (1.778 m)   Wt 104.3 kg   SpO2 100%   BMI 33.00 kg/m   Physical Exam Vitals and nursing note reviewed.  Constitutional:      General: He is not in acute distress.    Appearance: Normal appearance. He is not ill-appearing or diaphoretic.  HENT:     Head: Normocephalic and atraumatic.     Nose: No congestion or rhinorrhea.  Eyes:     Conjunctiva/sclera: Conjunctivae normal.  Cardiovascular:     Rate and Rhythm: Normal rate and regular rhythm.  Pulmonary:     Effort: Pulmonary effort is normal.  Musculoskeletal:     Cervical back: Neck supple.     Right lower leg: No edema.     Left lower leg: No edema.     Comments: Patient's left hand was visualized he has a V shaped laceration on his left knuckle, measuring approximately 6 cm in length, 3 mm in depth, hemodynamically stable, appears he nicked the extender tendon, no other foreign bodies present.  He has full range of motion at his MCP PIP and DIP joint, neurovascular fully intact.  Please see picture for full detail.  Skin:    General: Skin is warm and dry.     Coloration: Skin is not jaundiced or pale.  Neurological:     Mental Status: He is alert and oriented to person, place, and time.  Psychiatric:        Mood and Affect: Mood normal.         ED Results / Procedures / Treatments   Labs (all labs ordered are listed, but only abnormal results are displayed) Labs Reviewed - No data to display  EKG None  Radiology DG Finger Index Left  Result Date: 05/12/2021 CLINICAL DATA:  Laceration EXAM: LEFT INDEX FINGER 2+V COMPARISON:  None. FINDINGS: No fracture or malalignment. Edema in the soft tissues. No radiopaque foreign body IMPRESSION: No acute osseous abnormality Electronically Signed   By: 05/14/2021 M.D.   On: 05/12/2021 21:35    Procedures .08/21/2022Laceration Repair  Date/Time: 05/12/2021 11:14 PM Performed by: 05/14/2021, PA-C Authorized by: Carroll Sage, PA-C    Consent:    Consent obtained:  Verbal   Consent given by:  Patient   Risks discussed:  Infection, pain, retained foreign body, need for additional repair, poor cosmetic result, tendon damage, vascular damage, poor wound healing and nerve damage   Alternatives discussed:  No treatment, delayed treatment, observation and referral Universal protocol:    Patient identity confirmed:  Verbally with patient Anesthesia:    Anesthesia method:  Local infiltration   Local anesthetic:  Lidocaine 1% w/o epi Laceration details:    Location:  Finger   Finger location:  L index finger   Length (cm):  6   Depth (mm):  3 Pre-procedure details:  Preparation:  Patient was prepped and draped in usual sterile fashion and imaging obtained to evaluate for foreign bodies Exploration:    Limited defect created (wound extended): no     Hemostasis achieved with:  Direct pressure   Imaging obtained: x-ray     Imaging outcome: foreign body not noted     Wound exploration: wound explored through full range of motion     Wound extent: tendon damage     Tendon damage location:  Upper extremity   Upper extremity tendon damage location:  Finger extensor   Tendon damage extent:  Partial transection   Tendon repair plan:  Refer for evaluation   Contaminated: no   Treatment:    Area cleansed with:  Saline   Amount of cleaning:  Standard   Irrigation solution:  Sterile water   Irrigation method:  Syringe   Visualized foreign bodies/material removed: no     Debridement:  None Skin repair:    Repair method:  Sutures   Suture size:  4-0   Suture material:  Prolene   Suture technique:  Simple interrupted   Number of sutures:  6 Approximation:    Approximation:  Close Repair type:    Repair type:  Simple Post-procedure details:    Dressing:  Antibiotic ointment and splint for protection   Procedure completion:  Tolerated well, no immediate complications Comments:     After the procedure patient motor,  sensation, strength were all intact.area was soft to the touch with good capillary refill.  No signs of infection were noted, no rash, no ligament or tendon damage present.   Medications Ordered in ED Medications  cephALEXin (KEFLEX) capsule 500 mg (has no administration in time range)  Tdap (BOOSTRIX) injection 0.5 mL (0.5 mLs Intramuscular Given 05/12/21 2206)  lidocaine (PF) (XYLOCAINE) 1 % injection 5 mL (5 mLs Infiltration Given 05/12/21 2256)    ED Course  I have reviewed the triage vital signs and the nursing notes.  Pertinent labs & imaging results that were available during my care of the patient were reviewed by me and considered in my medical decision making (see chart for details).    MDM Rules/Calculators/A&P                          Initial impression-patient presents with a laceration on his left knuckle.  He is alert, does not appear acute stress, vital signs reassuring.  Will obtain imaging, update patient's tetanus shot, consult with hand surgery for further recommendations.  Work-up-imaging is negative for acute findings.  Reassessment-updated patient and these recommendations she is in room this plan.  Will recommend suturing to decrease infection risk and to assist with the healing process.  Patient was agreeable with this and tolerated the procedure well.  He received 6 sutures.  Neurovascular was fully intact after the procedure.   Consult-spoke with Dr. Merlyn LotKuzma of hand surgery, he recommends stitching of the wound, starting him on antibiotics, place him in a splint, and he will follow-up with him next week.  Rule out-Low suspicion for fracture or dislocation as x-ray does not reveal any acute findings.  Low suspicion for foreign body as wound was evaluated there is no foreign objects present, imaging was also negative.  Low suspicion for a complete laceration of the extender tendon as he is held full range of motion at his MCP joint, PIP joint and DIP joint, but I  suspect there is partial damage as there is  a small defect noted on my exam.  Low suspicion for compartment syndrome as area was palpated it was soft to the touch, neurovascular fully intact before and after the procedure  Plan-  Laceration-patient received 6 sutures, was placed in a splint, will provide pain medications, started on antibiotics, follow-up with hand surgery for further evaluation.  Vital signs have remained stable, no indication for hospital admission.  Patient discussed with attending and they agreed with assessment and plan.  Patient given at home care as well strict return precautions.  Patient verbalized that they understood agreed to said plan.  Final Clinical Impression(s) / ED Diagnoses Final diagnoses:  Laceration of left index finger without foreign body without damage to nail, initial encounter    Rx / DC Orders ED Discharge Orders          Ordered    cephALEXin (KEFLEX) 500 MG capsule  4 times daily        05/12/21 2317    oxyCODONE-acetaminophen (PERCOCET/ROXICET) 5-325 MG tablet  Every 8 hours PRN        05/12/21 2317    oxyCODONE-acetaminophen (PERCOCET/ROXICET) 5-325 MG tablet  Every 8 hours PRN        05/12/21 2319             Carroll Sage, PA-C 05/12/21 2322    Mancel Bale, MD 05/13/21 [phone]

## 2021-05-12 NOTE — ED Triage Notes (Signed)
Pt arrived via POV following deep laceration to left hand. Pt reports using a table saw while cutting boards to frame windows and his hand glided into the saw. Bleeding not controlled at this time. This RN applied pressure dressing in Triage.

## 2021-05-15 MED FILL — Oxycodone w/ Acetaminophen Tab 5-325 MG: ORAL | Qty: 6 | Status: AC

## 2021-08-10 ENCOUNTER — Emergency Department (HOSPITAL_COMMUNITY)
Admission: EM | Admit: 2021-08-10 | Discharge: 2021-08-11 | Disposition: A | Discharge status: Home / Self Care | Payer: Self-pay | Attending: Emergency Medicine | Admitting: Emergency Medicine

## 2021-08-10 ENCOUNTER — Emergency Department (HOSPITAL_COMMUNITY): Payer: Self-pay

## 2021-08-10 ENCOUNTER — Other Ambulatory Visit: Payer: Self-pay

## 2021-08-10 ENCOUNTER — Encounter (HOSPITAL_COMMUNITY): Payer: Self-pay

## 2021-08-10 DIAGNOSIS — Z7982 Long term (current) use of aspirin: Secondary | ICD-10-CM | POA: Insufficient documentation

## 2021-08-10 DIAGNOSIS — N201 Calculus of ureter: Secondary | ICD-10-CM | POA: Insufficient documentation

## 2021-08-10 LAB — CBC WITH DIFFERENTIAL/PLATELET
Abs Immature Granulocytes: 0.03 10*3/uL (ref 0.00–0.07)
Basophils Absolute: 0 10*3/uL (ref 0.0–0.1)
Basophils Relative: 0 %
Eosinophils Absolute: 0.1 10*3/uL (ref 0.0–0.5)
Eosinophils Relative: 0 %
HCT: 45.3 % (ref 39.0–52.0)
Hemoglobin: 15.3 g/dL (ref 13.0–17.0)
Immature Granulocytes: 0 %
Lymphocytes Relative: 15 %
Lymphs Abs: 1.9 10*3/uL (ref 0.7–4.0)
MCH: 31.2 pg (ref 26.0–34.0)
MCHC: 33.8 g/dL (ref 30.0–36.0)
MCV: 92.4 fL (ref 80.0–100.0)
Monocytes Absolute: 1 10*3/uL (ref 0.1–1.0)
Monocytes Relative: 8 %
Neutro Abs: 9.3 10*3/uL — ABNORMAL HIGH (ref 1.7–7.7)
Neutrophils Relative %: 77 %
Platelets: 228 10*3/uL (ref 150–400)
RBC: 4.9 MIL/uL (ref 4.22–5.81)
RDW: 12.8 % (ref 11.5–15.5)
WBC: 12.3 10*3/uL — ABNORMAL HIGH (ref 4.0–10.5)
nRBC: 0 % (ref 0.0–0.2)

## 2021-08-10 LAB — URINALYSIS, ROUTINE W REFLEX MICROSCOPIC
Bacteria, UA: NONE SEEN
Bilirubin Urine: NEGATIVE
Glucose, UA: NEGATIVE mg/dL
Ketones, ur: 80 mg/dL — AB
Leukocytes,Ua: NEGATIVE
Nitrite: NEGATIVE
Protein, ur: 30 mg/dL — AB
Specific Gravity, Urine: 1.026 (ref 1.005–1.030)
pH: 5 (ref 5.0–8.0)

## 2021-08-10 LAB — BASIC METABOLIC PANEL
Anion gap: 9 (ref 5–15)
BUN: 17 mg/dL (ref 6–20)
CO2: 24 mmol/L (ref 22–32)
Calcium: 9 mg/dL (ref 8.9–10.3)
Chloride: 103 mmol/L (ref 98–111)
Creatinine, Ser: 1.33 mg/dL — ABNORMAL HIGH (ref 0.61–1.24)
GFR, Estimated: >60 mL/min (ref >60)
Glucose, Bld: 127 mg/dL — ABNORMAL HIGH (ref 70–99)
Potassium: 3.5 mmol/L (ref 3.5–5.1)
Sodium: 136 mmol/L (ref 135–145)

## 2021-08-10 MED ORDER — HYDROMORPHONE HCL 1 MG/ML IJ SOLN
1.0000 mg | Freq: Once | INTRAMUSCULAR | Status: AC
  Administered 2021-08-10: 22:00:00 1 mg via INTRAVENOUS
  Filled 2021-08-10: qty 1

## 2021-08-10 MED ORDER — SODIUM CHLORIDE 0.9 % IV BOLUS
1000.0000 mL | Freq: Once | INTRAVENOUS | Status: AC
  Administered 2021-08-10: 22:00:00 1000 mL via INTRAVENOUS

## 2021-08-10 MED ORDER — ONDANSETRON HCL 4 MG/2ML IJ SOLN
4.0000 mg | Freq: Once | INTRAMUSCULAR | Status: AC
  Administered 2021-08-10: 22:00:00 4 mg via INTRAVENOUS
  Filled 2021-08-10: qty 2

## 2021-08-10 MED ORDER — KETOROLAC TROMETHAMINE 30 MG/ML IJ SOLN
15.0000 mg | Freq: Once | INTRAMUSCULAR | Status: AC
  Administered 2021-08-10: 23:00:00 15 mg via INTRAVENOUS
  Filled 2021-08-10: qty 1

## 2021-08-10 MED ORDER — TAMSULOSIN HCL 0.4 MG PO CAPS
0.4000 mg | ORAL_CAPSULE | Freq: Once | ORAL | Status: AC
  Administered 2021-08-10: 23:00:00 0.4 mg via ORAL
  Filled 2021-08-10: qty 1

## 2021-08-10 NOTE — ED Notes (Signed)
Patient in restroom at this time. Patient states that he needs to have a bowel movement

## 2021-08-10 NOTE — ED Notes (Signed)
Patient transported to CT 

## 2021-08-10 NOTE — ED Triage Notes (Signed)
Pt reports being at work yesterday [employer], [employer]) when his lower back started hurting on left side (lumbar area) -pt says he felt this pain previously when he had kidney stone- pt denies urinary sx. Pt in NAD.

## 2021-08-11 MED ORDER — OXYCODONE-ACETAMINOPHEN 5-325 MG PO TABS: 1.0000 | ORAL_TABLET | ORAL | 0 refills | Status: DC | PRN

## 2021-08-11 MED ORDER — OXYCODONE-ACETAMINOPHEN 5-325 MG PO TABS: 1.0000 | ORAL_TABLET | Freq: Four times a day (QID) | ORAL | 0 refills | Status: DC | PRN

## 2021-08-11 MED ORDER — TAMSULOSIN HCL 0.4 MG PO CAPS: 0.4000 mg | ORAL_CAPSULE | Freq: Every day | ORAL | 0 refills | Status: DC

## 2021-08-11 NOTE — Discharge Instructions (Signed)
Call [facility] for follow-up care of this kidney stone.  You have been prescribed some pain medication, do not drive within 4 hours of taking this as it will make you drowsy.  Take your next dose of Flomax tomorrow evening.  This medication is given to help you pass this kidney stone hopefully easier and quicker.  Strain your urine with a urine strainer given so you will know if the stone has passed.  You will need to get rechecked immediately if you develop fever, uncontrolled pain or any new or worsening symptoms.

## 2021-08-11 NOTE — ED Provider Notes (Signed)
[facility] EMERGENCY DEPARTMENT Provider Note   CSN: [id number] Arrival date & time: 08/10/21  1850     History Chief Complaint  Patient presents with   Back Pain    Travis Mayer is a 35 y.o. male with a prior history of kidney stones about 5 years ago presenting with left lower back and flank pain which started yesterday when he was at work.  He describes severe pain which is intermittent and occurs randomly.  It is not triggered by movement or positional changes.  He reports nausea without emesis with these pain episodes.  His symptoms remind him of his prior kidney stone attack.  He denies fevers or chills, no dysuria and has not paid attention to his urine so cannot say if he has had a hematuria.  He has had no medications or treatment prior to arrival.  The history is provided by the patient.      Past Medical History:  Diagnosis Date   Kidney stone     There are no problems to display for this patient.   Past Surgical History:  Procedure Laterality Date   APPENDECTOMY         No family history on file.  Social History   Tobacco Use   Smoking status: Never   Smokeless tobacco: Never  Vaping Use   Vaping Use: Never used  Substance Use Topics   Alcohol use: No   Drug use: No    Home Medications Prior to Admission medications   Medication Sig Start Date End Date Taking? Authorizing Provider  Aspirin-Salicylamide-Caffeine (ARTHRITIS STRENGTH BC POWDER PO) Take 1 packet by mouth daily as needed (pain).   Yes [provider]  oxyCODONE-acetaminophen (PERCOCET/ROXICET) 5-325 MG tablet Take 1 tablet by mouth every 6 (six) hours as needed for severe pain. 08/11/21  Yes Bryce Kimble, Almyra Free, PA-C  oxyCODONE-acetaminophen (PERCOCET/ROXICET) 5-325 MG tablet Take 1 tablet by mouth every 4 (four) hours as needed. 08/11/21  Yes Nadiah Corbit, Almyra Free, PA-C  tamsulosin (FLOMAX) 0.4 MG CAPS capsule Take 1 capsule (0.4 mg total) by mouth daily after supper. 08/11/21  Yes  Tamalyn Wadsworth, Almyra Free, PA-C  cyclobenzaprine (FLEXERIL) 10 MG tablet Take 1 tablet (10 mg total) by mouth 2 (two) times daily as needed for muscle spasms. Patient not taking: Reported on 02/10/2021 10/02/18   Noemi Chapel, MD  doxycycline (VIBRAMYCIN) 100 MG capsule Take 1 capsule (100 mg total) by mouth 2 (two) times daily. Patient not taking: Reported on 02/10/2021 03/01/18   Lily Kocher, PA-C  HYDROcodone-acetaminophen (NORCO/VICODIN) 5-325 MG tablet Take one tab po q 4 hrs prn pain Patient not taking: Reported on 02/10/2021 10/04/18   Kem Parkinson, PA-C  ibuprofen (ADVIL,MOTRIN) 600 MG tablet Take 1 tablet (600 mg total) by mouth every 6 (six) hours as needed. Patient not taking: Reported on 02/10/2021 10/02/18   Noemi Chapel, MD  meloxicam (MOBIC) 7.5 MG tablet Take 1 tablet (7.5 mg total) by mouth daily. Patient not taking: Reported on 08/10/2021 02/10/21   Tedd Sias, PA  methocarbamol (ROBAXIN) 500 MG tablet Take 1 tablet (500 mg total) by mouth 2 (two) times daily. Patient not taking: Reported on 08/10/2021 02/10/21   Tedd Sias, PA  omeprazole (PRILOSEC) 20 MG capsule Take 1 capsule (20 mg total) by mouth daily. Patient not taking: Reported on 02/10/2021 10/04/18   Triplett, Tammy, PA-C  ondansetron (ZOFRAN) 4 MG tablet Take 1 tablet (4 mg total) by mouth every 6 (six) hours. Patient not taking: Reported on 02/10/2021 10/03/18  Couture, Cortni S, PA-C    Allergies    Patient has no known allergies.  Review of Systems   Review of Systems  Constitutional:  Negative for chills and fever.  HENT: Negative.    Eyes: Negative.   Respiratory:  Negative for chest tightness and shortness of breath.   Cardiovascular:  Negative for chest pain.  Gastrointestinal:  Positive for nausea. Negative for abdominal pain and vomiting.  Genitourinary:  Positive for flank pain.  Musculoskeletal:  Negative for arthralgias, joint swelling and neck pain.  Skin: Negative.  Negative for rash and wound.   Neurological:  Negative for dizziness, weakness, light-headedness, numbness and headaches.  Psychiatric/Behavioral: Negative.     Physical Exam Updated Vital Signs BP (!) 149/92   Pulse 92   Temp 98.1 F (36.7 C) (Oral)   Resp 16   Ht 5\' 10"  (1.778 m)   Wt 108.9 kg   SpO2 97%   BMI 34.44 kg/m   Physical Exam Vitals and nursing note reviewed.  Constitutional:      Appearance: He is well-developed.  HENT:     Head: Normocephalic and atraumatic.  Eyes:     Conjunctiva/sclera: Conjunctivae normal.  Cardiovascular:     Rate and Rhythm: Normal rate and regular rhythm.     Heart sounds: Normal heart sounds.  Pulmonary:     Effort: Pulmonary effort is normal.     Breath sounds: Normal breath sounds. No wheezing.  Abdominal:     General: Bowel sounds are normal.     Palpations: Abdomen is soft.     Tenderness: There is no abdominal tenderness. There is no right CVA tenderness, left CVA tenderness, guarding or rebound.  Musculoskeletal:        General: Normal range of motion.     Cervical back: Normal range of motion.  Skin:    General: Skin is warm and dry.  Neurological:     Mental Status: He is alert.    ED Results / Procedures / Treatments   Labs (all labs ordered are listed, but only abnormal results are displayed) Labs Reviewed  BASIC METABOLIC PANEL - Abnormal; Notable for the following components:      Result Value   Glucose, Bld 127 (*)    Creatinine, Ser 1.33 (*)    All other components within normal limits  CBC WITH DIFFERENTIAL/PLATELET - Abnormal; Notable for the following components:   WBC 12.3 (*)    Neutro Abs 9.3 (*)    All other components within normal limits  URINALYSIS, ROUTINE W REFLEX MICROSCOPIC - Abnormal; Notable for the following components:   Hgb urine dipstick SMALL (*)    Ketones, ur 80 (*)    Protein, ur 30 (*)    All other components within normal limits    EKG None  Radiology CT Renal Stone Study  Result Date:  08/10/2021 CLINICAL DATA:  Lower back pain. EXAM: CT ABDOMEN AND PELVIS WITHOUT CONTRAST TECHNIQUE: Multidetector CT imaging of the abdomen and pelvis was performed following the standard protocol without IV contrast. COMPARISON:  June 14, 2016 FINDINGS: Lower chest: A stable 6 mm noncalcified lung nodule is seen within the anteromedial aspect of the right middle lobe. Hepatobiliary: No focal liver abnormality is seen. No gallstones, gallbladder wall thickening, or biliary dilatation. Pancreas: Unremarkable. No pancreatic ductal dilatation or surrounding inflammatory changes. Spleen: Normal in size without focal abnormality. Adrenals/Urinary Tract: Adrenal glands are unremarkable. Kidneys are normal, without focal lesions. An ill-defined 2 mm obstructing renal stone is  seen at the left UVJ (axial CT image 81, CT series 2). There is very mild left-sided hydronephrosis and mild hydroureter. The urinary bladder is contracted and subsequently limited in evaluation. Stomach/Bowel: Stomach is within normal limits. The appendix is surgically absent. No evidence of bowel wall thickening, distention, or inflammatory changes. Vascular/Lymphatic: No significant vascular findings are present. No enlarged abdominal or pelvic lymph nodes. Reproductive: Prostate is unremarkable. Other: No abdominal wall hernia or abnormality. No abdominopelvic ascites. Musculoskeletal: No acute or significant osseous findings. IMPRESSION: 1. Ill-defined, obstructing 2 mm renal stone at the left UVJ. 2. Stable 6 mm noncalcified right middle lobe lung nodule, likely benign. Electronically Signed   By: Virgina Norfolk M.D.   On: 08/10/2021 22:14    Procedures Procedures   Medications Ordered in ED Medications  sodium chloride 0.9 % bolus 1,000 mL (1,000 mLs Intravenous New Bag/Given 08/10/21 2152)  HYDROmorphone (DILAUDID) injection 1 mg (1 mg Intravenous Given 08/10/21 2149)  ondansetron (ZOFRAN) injection 4 mg (4 mg Intravenous  Given 08/10/21 2149)  tamsulosin (FLOMAX) capsule 0.4 mg (0.4 mg Oral Given 08/10/21 2326)  ketorolac (TORADOL) 30 MG/ML injection 15 mg (15 mg Intravenous Given 08/10/21 2326)    ED Course  I have reviewed the triage vital signs and the nursing notes.  Pertinent labs & imaging results that were available during my care of the patient were reviewed by me and considered in my medical decision making (see chart for details).    MDM Rules/Calculators/A&P                           Patient with a left 2 mm stone at the UVJ.  He has no UTI.  His labs are reassuring.  He was given IV fluids and pain medication, also his first dose of Flomax here.  He was given home instructions regarding straining his urine, strict return precautions including worse pain, fevers or any new or worsening symptoms.  Referral to [facility] for follow-up care. Final Clinical Impression(s) / ED Diagnoses Final diagnoses:  Left ureteral stone    Rx / DC Orders ED Discharge Orders          Ordered    oxyCODONE-acetaminophen (PERCOCET/ROXICET) 5-325 MG tablet  Every 6 hours PRN        08/11/21 0004    oxyCODONE-acetaminophen (PERCOCET/ROXICET) 5-325 MG tablet  Every 4 hours PRN        08/11/21 0006    tamsulosin (FLOMAX) 0.4 MG CAPS capsule  Daily after supper        08/11/21 0006             Evalee Jefferson, PA-C 08/11/21 0011    Sherwood Gambler, MD 08/12/21 [phone]

## 2021-08-14 MED FILL — Oxycodone w/ Acetaminophen Tab 5-325 MG: ORAL | Qty: 6 | Status: AC

## 2021-10-21 ENCOUNTER — Emergency Department (HOSPITAL_COMMUNITY): Payer: No Typology Code available for payment source

## 2021-10-21 ENCOUNTER — Other Ambulatory Visit: Payer: Self-pay

## 2021-10-21 ENCOUNTER — Emergency Department (HOSPITAL_COMMUNITY)
Admission: EM | Admit: 2021-10-21 | Discharge: 2021-10-21 | Disposition: A | Discharge status: Home / Self Care | Payer: No Typology Code available for payment source | Attending: Emergency Medicine | Admitting: Emergency Medicine

## 2021-10-21 ENCOUNTER — Encounter (HOSPITAL_COMMUNITY): Payer: Self-pay | Admitting: Emergency Medicine

## 2021-10-21 DIAGNOSIS — Y92410 Unspecified street and highway as the place of occurrence of the external cause: Secondary | ICD-10-CM | POA: Diagnosis not present

## 2021-10-21 DIAGNOSIS — S199XXA Unspecified injury of neck, initial encounter: Secondary | ICD-10-CM | POA: Diagnosis not present

## 2021-10-21 DIAGNOSIS — R42 Dizziness and giddiness: Secondary | ICD-10-CM | POA: Insufficient documentation

## 2021-10-21 DIAGNOSIS — S0990XA Unspecified injury of head, initial encounter: Secondary | ICD-10-CM | POA: Insufficient documentation

## 2021-10-21 DIAGNOSIS — S3992XA Unspecified injury of lower back, initial encounter: Secondary | ICD-10-CM | POA: Diagnosis not present

## 2021-10-21 MED ORDER — METHOCARBAMOL 500 MG PO TABS
500.0000 mg | ORAL_TABLET | Freq: Once | ORAL | Status: AC
  Administered 2021-10-21: 500 mg via ORAL
  Filled 2021-10-21: qty 1

## 2021-10-21 MED ORDER — METHOCARBAMOL 500 MG PO TABS: 500.0000 mg | ORAL_TABLET | Freq: Two times a day (BID) | ORAL | 0 refills | Status: AC

## 2021-10-21 NOTE — Discharge Instructions (Signed)

## 2021-10-21 NOTE — ED Notes (Signed)
Pt d/c home per MD order. Discharge summary reviewed with pt, pt verbalizes understanding. Ambulatory off unit. No s/s of acute distress noted at discharge.  °

## 2021-10-21 NOTE — ED Notes (Signed)
Patient transported to CT 
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
## 2022-05-23 IMAGING — CT CT RENAL STONE PROTOCOL
2 of 4 series · 16 of 46 positions shown, 18 images · non-contrast
Comparison: June 14, 2016

CLINICAL DATA: Lower back pain.

EXAM:
CT ABDOMEN AND PELVIS WITHOUT CONTRAST
TECHNIQUE: Multidetector CT imaging of the abdomen and pelvis was performed
following the standard protocol without IV contrast.

[Series 2: axial st · axial · 0.73mm/px · z∈[+50,+495]mm · 13 of 101 slices shown, 15 images]
[im 6/101  soft-tissue]
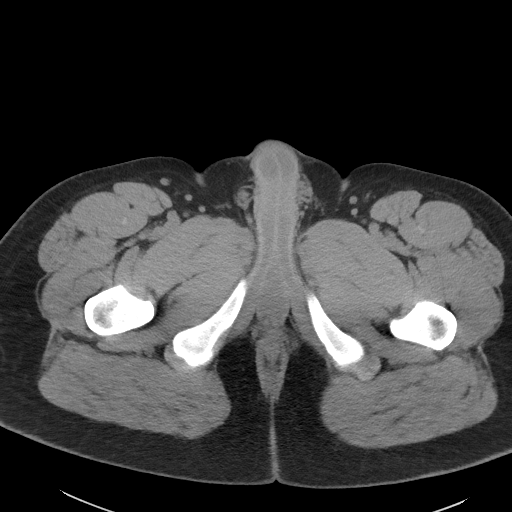
[im 6/101  bone]
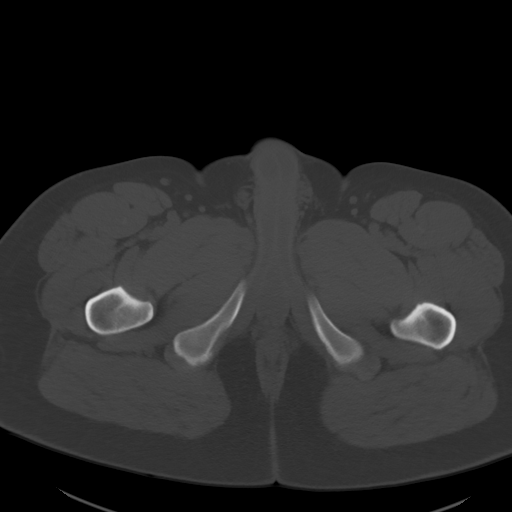
[im 12/101  soft-tissue]
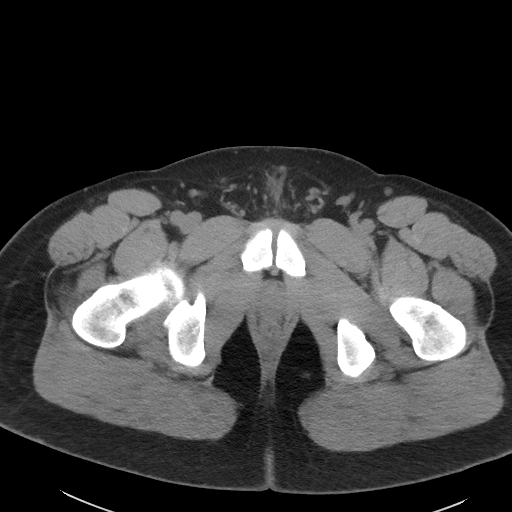
[im 23/101  soft-tissue]
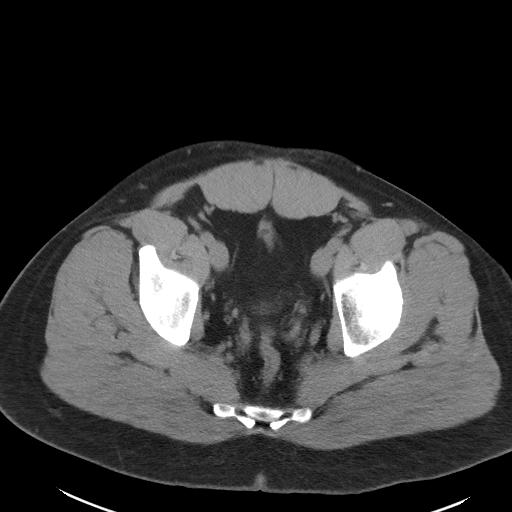
[im 28/101  soft-tissue]
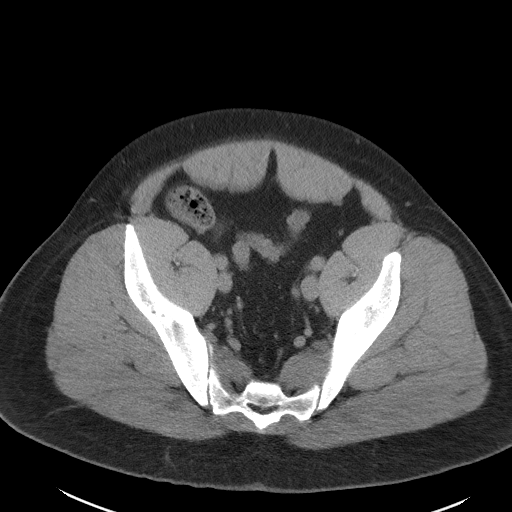
[im 34/101  soft-tissue]
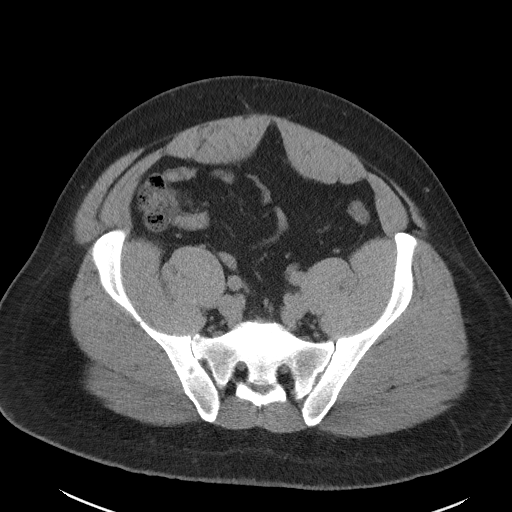
[im 45/101  soft-tissue]
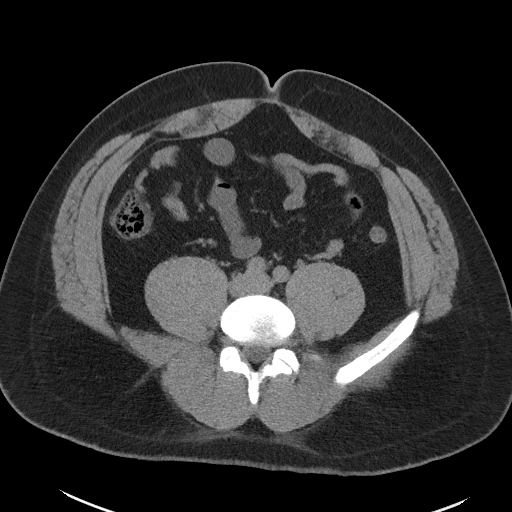
[im 51/101  soft-tissue]
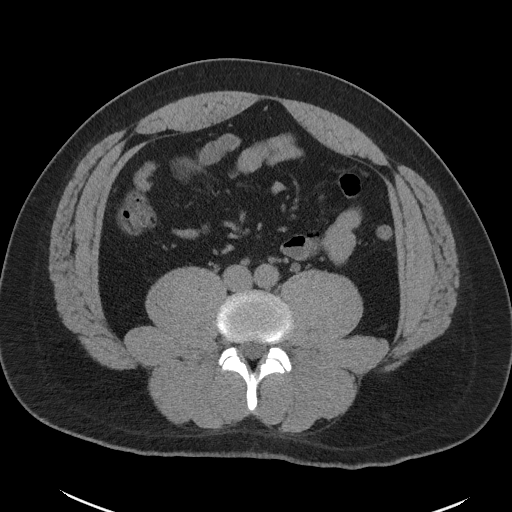
[im 56/101  soft-tissue]
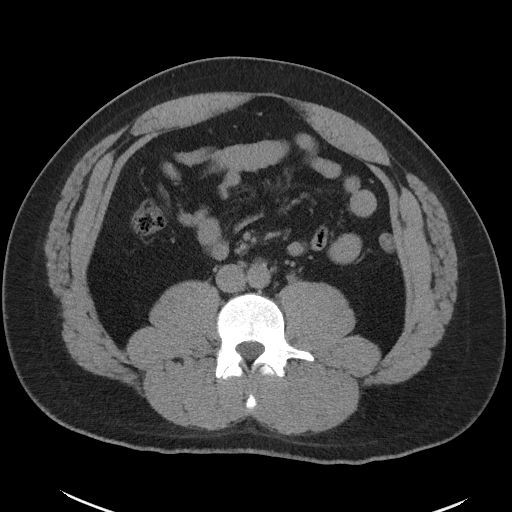
[im 67/101  soft-tissue]
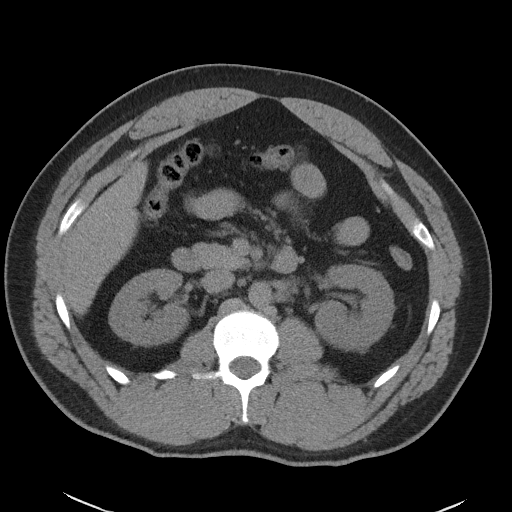
[im 67/101  bone]
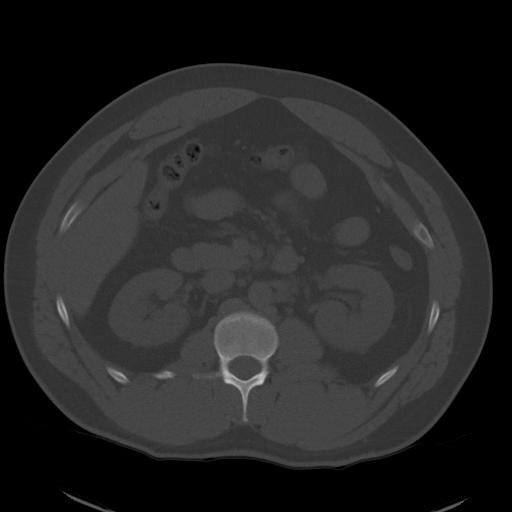
[im 73/101  soft-tissue]
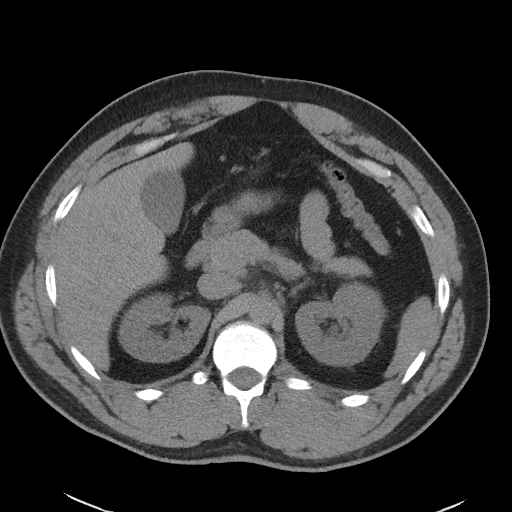
[im 78/101  soft-tissue]
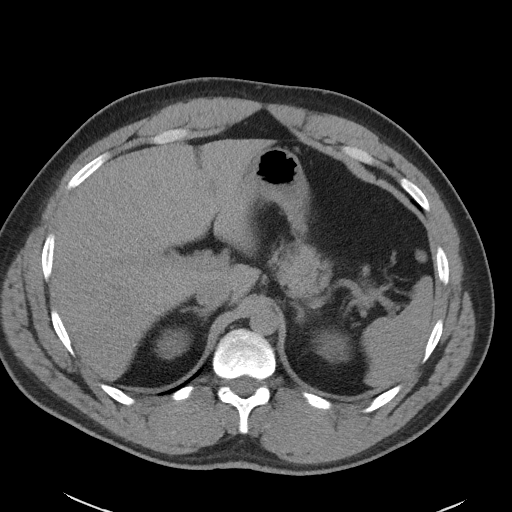
[im 89/101  soft-tissue]
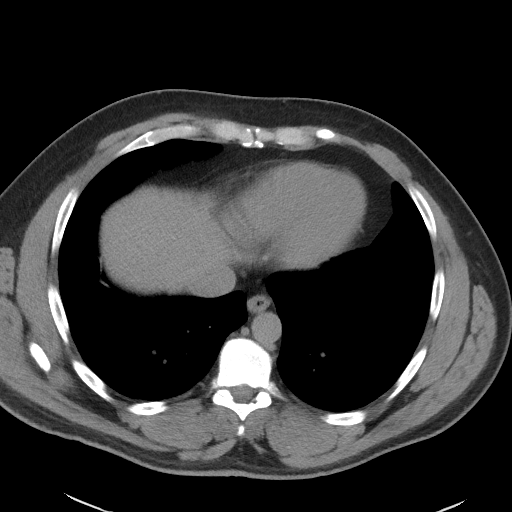
[im 95/101  soft-tissue]
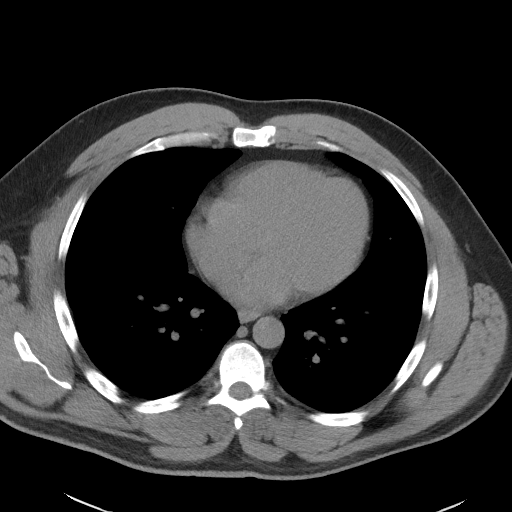

[Series 5: coronal st · coronal · 0.85mm/px · 3 of 101 slices shown]
[im 34/101  soft-tissue]
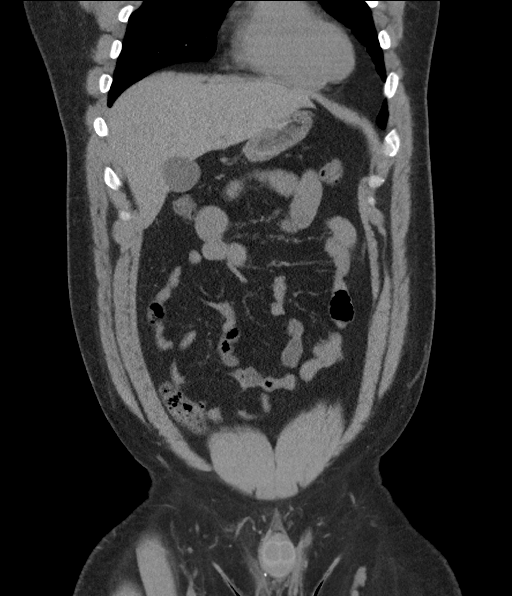
[im 45/101  soft-tissue]
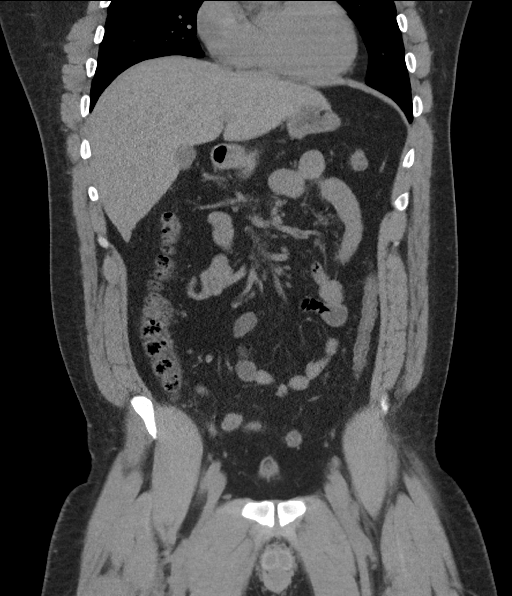
[im 56/101  soft-tissue]
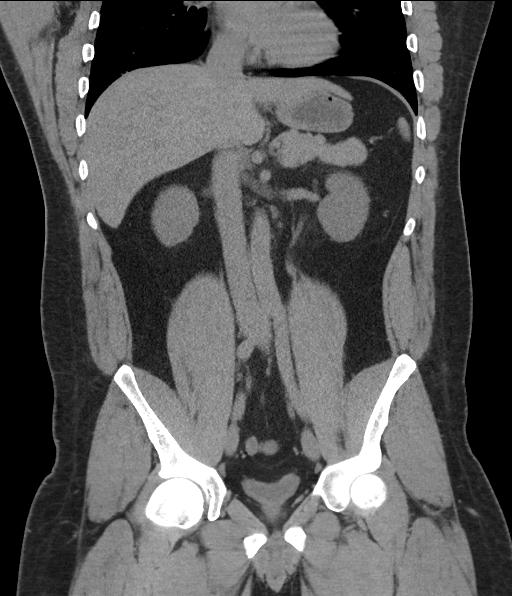

[16 of 46 positions shown; findings below may reference images not displayed]

FINDINGS: Lower chest: A stable 6 mm noncalcified lung nodule is seen within
the anteromedial aspect of the right middle lobe.

Hepatobiliary: No focal liver abnormality is seen. No gallstones,
gallbladder wall thickening, or biliary dilatation.

Pancreas: Unremarkable. No pancreatic ductal dilatation or
surrounding inflammatory changes.

Spleen: Normal in size without focal abnormality.

Adrenals/Urinary Tract: Adrenal glands are unremarkable. Kidneys are
normal, without focal lesions. An ill-defined 2 mm obstructing renal
stone is seen at the left UVJ (axial CT image 81, CT series 2).
There is very mild left-sided hydronephrosis and mild hydroureter.
The urinary bladder is contracted and subsequently limited in
evaluation.

Stomach/Bowel: Stomach is within normal limits. The appendix is
surgically absent. No evidence of bowel wall thickening, distention,
or inflammatory changes.

Vascular/Lymphatic: No significant vascular findings are present. No
enlarged abdominal or pelvic lymph nodes.

Reproductive: Prostate is unremarkable.

Other: No abdominal wall hernia or abnormality. No abdominopelvic
ascites.

Musculoskeletal: No acute or significant osseous findings.
IMPRESSION: 1. Ill-defined, obstructing 2 mm renal stone at the left UVJ.
2. Stable 6 mm noncalcified right middle lobe lung nodule, likely
benign.

## 2022-05-29 ENCOUNTER — Encounter (HOSPITAL_COMMUNITY): Payer: Self-pay | Admitting: Emergency Medicine

## 2022-05-29 ENCOUNTER — Emergency Department (HOSPITAL_COMMUNITY)
Admission: EM | Admit: 2022-05-29 | Discharge: 2022-05-29 | Disposition: A | Discharge status: Home / Self Care | Payer: Self-pay | Attending: Emergency Medicine | Admitting: Emergency Medicine

## 2022-05-29 ENCOUNTER — Other Ambulatory Visit: Payer: Self-pay

## 2022-05-29 DIAGNOSIS — J069 Acute upper respiratory infection, unspecified: Secondary | ICD-10-CM | POA: Insufficient documentation

## 2022-05-29 DIAGNOSIS — Z20822 Contact with and (suspected) exposure to covid-19: Secondary | ICD-10-CM | POA: Insufficient documentation

## 2022-05-29 DIAGNOSIS — Z7982 Long term (current) use of aspirin: Secondary | ICD-10-CM | POA: Insufficient documentation

## 2022-05-29 LAB — RESP PANEL BY RT-PCR (FLU A&B, COVID) ARPGX2
Influenza A by PCR: NEGATIVE
Influenza B by PCR: NEGATIVE
SARS Coronavirus 2 by RT PCR: NEGATIVE

## 2022-05-29 MED ORDER — IBUPROFEN 400 MG PO TABS
600.0000 mg | ORAL_TABLET | Freq: Once | ORAL | Status: AC
  Administered 2022-05-29: 600 mg via ORAL
  Filled 2022-05-29: qty 2

## 2022-05-29 MED ORDER — ONDANSETRON 4 MG PO TBDP
4.0000 mg | ORAL_TABLET | Freq: Once | ORAL | Status: AC
  Administered 2022-05-29: 4 mg via ORAL
  Filled 2022-05-29: qty 1

## 2022-05-29 MED ORDER — BENZONATATE 200 MG PO CAPS: 200.0000 mg | ORAL_CAPSULE | Freq: Three times a day (TID) | ORAL | 0 refills | Status: DC

## 2022-05-29 MED ORDER — ONDANSETRON HCL 4 MG PO TABS: 4.0000 mg | ORAL_TABLET | Freq: Four times a day (QID) | ORAL | 0 refills | Status: AC

## 2022-05-29 NOTE — ED Provider Notes (Signed)
[facility] EMERGENCY DEPARTMENT Provider Note   CSN: [id number] Arrival date & time: 05/29/22  7829     History  Chief Complaint  Patient presents with   Cough    Travis Mayer is a 36 y.o. male.  Travis Mayer is a 36 y.o. male who is otherwise healthy, presents for evaluation of cough, sore throat, chills and body aches.  Symptoms have been present for 3 to 4 days.  He reports multiple people he has been in contact with tested positive for COVID.  He denies any associated chest pain or shortness of breath.  Some nausea but no vomiting or abdominal pain.  No meds prior to arrival.  No other aggravating or alleviating factors.  The history is provided by the patient and medical records.  Cough Associated symptoms: chills, myalgias, rhinorrhea and sore throat   Associated symptoms: no chest pain, no fever and no shortness of breath        Home Medications Prior to Admission medications   Medication Sig Start Date End Date Taking? Authorizing Provider  Aspirin-Salicylamide-Caffeine (ARTHRITIS STRENGTH BC POWDER PO) Take 1 packet by mouth daily as needed (pain).   Yes [provider]  benzonatate (TESSALON) 200 MG capsule Take 1 capsule (200 mg total) by mouth every 8 (eight) hours. 05/29/22  Yes Dartha Lodge, PA-C  ondansetron (ZOFRAN) 4 MG tablet Take 1 tablet (4 mg total) by mouth every 6 (six) hours. 05/29/22  Yes Dartha Lodge, PA-C      Allergies    Patient has no known allergies.    Review of Systems   Review of Systems  Constitutional:  Positive for chills. Negative for fever.  HENT:  Positive for congestion, rhinorrhea and sore throat.   Respiratory:  Positive for cough. Negative for shortness of breath.   Cardiovascular:  Negative for chest pain.  Gastrointestinal:  Positive for nausea. Negative for abdominal pain and vomiting.  Musculoskeletal:  Positive for myalgias. Negative for neck pain.    Physical Exam Updated Vital Signs BP (!)  134/96 (BP Location: Right Arm)   Pulse 74   Temp 98.2 F (36.8 C) (Oral)   Resp 18   SpO2 99%  Physical Exam Vitals and nursing note reviewed.  Constitutional:      General: He is not in acute distress.    Appearance: He is well-developed. He is not ill-appearing or diaphoretic.  HENT:     Head: Normocephalic and atraumatic.     Nose: Rhinorrhea present.     Mouth/Throat:     Mouth: Mucous membranes are moist.     Pharynx: Oropharynx is clear.  Eyes:     General:        Right eye: No discharge.        Left eye: No discharge.  Neck:     Comments: No rigidity Cardiovascular:     Rate and Rhythm: Normal rate and regular rhythm.     Heart sounds: Normal heart sounds. No murmur heard.    No friction rub. No gallop.  Pulmonary:     Effort: Pulmonary effort is normal. No respiratory distress.     Breath sounds: Normal breath sounds.     Comments: Respirations equal and unlabored, patient able to speak in full sentences, lungs clear to auscultation bilaterally  Abdominal:     General: Bowel sounds are normal. There is no distension.     Palpations: Abdomen is soft. There is no mass.     Tenderness: There  is no abdominal tenderness. There is no guarding.     Comments: Abdomen soft, nondistended, nontender to palpation in all quadrants without guarding or peritoneal signs  Musculoskeletal:        General: No deformity.     Cervical back: Neck supple.  Lymphadenopathy:     Cervical: No cervical adenopathy.  Skin:    General: Skin is warm and dry.     Capillary Refill: Capillary refill takes less than 2 seconds.  Neurological:     Mental Status: He is alert and oriented to person, place, and time.  Psychiatric:        Mood and Affect: Mood normal.        Behavior: Behavior normal.     ED Results / Procedures / Treatments   Labs (all labs ordered are listed, but only abnormal results are displayed) Labs Reviewed  RESP PANEL BY RT-PCR (FLU A&B, COVID) [id number]     EKG None  Radiology No results found.  Procedures Procedures    Medications Ordered in ED Medications  ibuprofen (ADVIL) tablet 600 mg (600 mg Oral Given 05/29/22 1159)  ondansetron (ZOFRAN-ODT) disintegrating tablet 4 mg (4 mg Oral Given 05/29/22 1200)    ED Course/ Medical Decision Making/ A&P                           Medical Decision Making Risk Prescription drug management.   Pt presents with nasal congestion, sore throat and cough. Pt is well appearing and vitals are normal. Lungs CTA on exam.  No tachycardia, tachypnea or hypoxia, overall low suspicion for pneumonia, do not feel chest x-ray is indicated.  Viral panel negative for COVID and influenza.  Patients symptoms are consistent with URI, likely viral etiology. Discussed that antibiotics are not indicated for viral infections. Pt will be discharged with symptomatic treatment.  Verbalizes understanding and is agreeable with plan. Pt is hemodynamically stable & in NAD prior to dc. Return precautions discussed, pt expresses understanding and agrees with plan.          Final Clinical Impression(s) / ED Diagnoses Final diagnoses:  Viral URI with cough  Suspected COVID-19 virus infection    Rx / DC Orders ED Discharge Orders          Ordered    benzonatate (TESSALON) 200 MG capsule  Every 8 hours        05/29/22 1240    ondansetron (ZOFRAN) 4 MG tablet  Every 6 hours        05/29/22 1240              Dartha Lodge, [place] 05/29/22 1243    Jacalyn Lefevre, MD 05/29/22 1531

## 2022-05-29 NOTE — ED Triage Notes (Signed)
Pt c/o cough, sore throat and cold chills with body aches x 4 days. Has been in contact with people who has covid. Nad. Ambulatory. Color wnl.

## 2022-05-29 NOTE — Discharge Instructions (Addendum)
Your symptoms are likely caused by a viral upper respiratory infection.  Your COVID and flu test were negative here today but given that you have had multiple exposures to COVID-positive people you should still quarantine at home as if you have COVID, test results can sometimes be falsely negative.  Antibiotics are not helpful in treating viral infection, the virus should run its course in about 5-7 days.  You should wear a mask for 5 days once you return to work.  Please make sure you are drinking plenty of fluids. You can treat your symptoms supportively with tylenol/ibuprofen for fevers and pains, Zyrtec and Flonase to help with nasal congestion, and prescribed cough medication and throat lozenges to help with cough.  Prescribed Zofran as needed for nausea and vomiting.  If your symptoms are not improving please follow up with you Primary doctor.   If you develop persistent fevers, shortness of breath or difficulty breathing, chest pain, severe headache and neck pain, persistent nausea and vomiting or other new or concerning symptoms return to the Emergency department.
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
## 2022-10-23 ENCOUNTER — Other Ambulatory Visit: Payer: Self-pay

## 2022-10-23 ENCOUNTER — Emergency Department (HOSPITAL_COMMUNITY): Payer: Self-pay

## 2022-10-23 ENCOUNTER — Emergency Department (HOSPITAL_COMMUNITY)
Admission: EM | Admit: 2022-10-23 | Discharge: 2022-10-23 | Disposition: A | Discharge status: Home / Self Care | Payer: Self-pay | Attending: Emergency Medicine | Admitting: Emergency Medicine

## 2022-10-23 ENCOUNTER — Encounter (HOSPITAL_COMMUNITY): Payer: Self-pay | Admitting: *Deleted

## 2022-10-23 DIAGNOSIS — G43809 Other migraine, not intractable, without status migrainosus: Secondary | ICD-10-CM | POA: Insufficient documentation

## 2022-10-23 DIAGNOSIS — Z7982 Long term (current) use of aspirin: Secondary | ICD-10-CM | POA: Insufficient documentation

## 2022-10-23 LAB — CBC WITH DIFFERENTIAL/PLATELET
Abs Immature Granulocytes: 0.01 10*3/uL (ref 0.00–0.07)
Basophils Absolute: 0 10*3/uL (ref 0.0–0.1)
Basophils Relative: 0 %
Eosinophils Absolute: 0.1 10*3/uL (ref 0.0–0.5)
Eosinophils Relative: 1 %
HCT: 45.5 % (ref 39.0–52.0)
Hemoglobin: 15.3 g/dL (ref 13.0–17.0)
Immature Granulocytes: 0 %
Lymphocytes Relative: 19 %
Lymphs Abs: 1.5 10*3/uL (ref 0.7–4.0)
MCH: 30.9 pg (ref 26.0–34.0)
MCHC: 33.6 g/dL (ref 30.0–36.0)
MCV: 91.9 fL (ref 80.0–100.0)
Monocytes Absolute: 0.7 10*3/uL (ref 0.1–1.0)
Monocytes Relative: 9 %
Neutro Abs: 5.6 10*3/uL (ref 1.7–7.7)
Neutrophils Relative %: 71 %
Platelets: 225 10*3/uL (ref 150–400)
RBC: 4.95 MIL/uL (ref 4.22–5.81)
RDW: 12.4 % (ref 11.5–15.5)
WBC: 8 10*3/uL (ref 4.0–10.5)
nRBC: 0 % (ref 0.0–0.2)

## 2022-10-23 LAB — COMPREHENSIVE METABOLIC PANEL
ALT: 18 U/L (ref 0–44)
AST: 16 U/L (ref 15–41)
Albumin: 4.2 g/dL (ref 3.5–5.0)
Alkaline Phosphatase: 70 U/L (ref 38–126)
Anion gap: 10 (ref 5–15)
BUN: 14 mg/dL (ref 6–20)
CO2: 24 mmol/L (ref 22–32)
Calcium: 9 mg/dL (ref 8.9–10.3)
Chloride: 102 mmol/L (ref 98–111)
Creatinine, Ser: 1.04 mg/dL (ref 0.61–1.24)
GFR, Estimated: >60 mL/min (ref >60)
Glucose, Bld: 108 mg/dL — ABNORMAL HIGH (ref 70–99)
Potassium: 3.5 mmol/L (ref 3.5–5.1)
Sodium: 136 mmol/L (ref 135–145)
Total Bilirubin: 0.7 mg/dL (ref 0.3–1.2)
Total Protein: 7.5 g/dL (ref 6.5–8.1)

## 2022-10-23 MED ORDER — KETOROLAC TROMETHAMINE 15 MG/ML IJ SOLN
15.0000 mg | Freq: Once | INTRAMUSCULAR | Status: AC
  Administered 2022-10-23: 15 mg via INTRAVENOUS
  Filled 2022-10-23: qty 1

## 2022-10-23 MED ORDER — SODIUM CHLORIDE 0.9 % IV BOLUS
500.0000 mL | Freq: Once | INTRAVENOUS | Status: AC
  Administered 2022-10-23: 500 mL via INTRAVENOUS

## 2022-10-23 MED ORDER — DIPHENHYDRAMINE HCL 50 MG/ML IJ SOLN
12.5000 mg | Freq: Once | INTRAMUSCULAR | Status: AC
  Administered 2022-10-23: 12.5 mg via INTRAVENOUS
  Filled 2022-10-23: qty 1

## 2022-10-23 MED ORDER — PROCHLORPERAZINE EDISYLATE 10 MG/2ML IJ SOLN
10.0000 mg | Freq: Once | INTRAMUSCULAR | Status: AC
  Administered 2022-10-23: 10 mg via INTRAVENOUS
  Filled 2022-10-23: qty 2

## 2022-10-23 NOTE — ED Provider Notes (Signed)
[facility] Provider Note   CSN: [id number] Arrival date & time: 10/23/22  1552     History  Chief Complaint  Patient presents with   Headache    Travis Mayer is a 37 y.o. male presenting with a headache.  Reports has been present for 3 days.  Says that it came on out of nowhere and is the worst headache he has had.  Did not try anything at home.  Reports that he is having difficulty getting comfortable and has some photophobia in his right eye.  No slurred speech, facial droop, weakness or blurred vision.   Headache Associated symptoms: eye pain and photophobia   Associated symptoms: no numbness and no weakness        Home Medications Prior to Admission medications   Medication Sig Start Date End Date Taking? Authorizing Provider  Aspirin-Salicylamide-Caffeine (ARTHRITIS STRENGTH BC POWDER PO) Take 1 packet by mouth daily as needed (pain).    [provider]  benzonatate (TESSALON) 200 MG capsule Take 1 capsule (200 mg total) by mouth every 8 (eight) hours. 05/29/22   Jacqlyn Larsen, PA-C  ondansetron (ZOFRAN) 4 MG tablet Take 1 tablet (4 mg total) by mouth every 6 (six) hours. 05/29/22   Jacqlyn Larsen, PA-C      Allergies    Patient has no known allergies.    Review of Systems   Review of Systems  Eyes:  Positive for photophobia and pain.  Neurological:  Positive for headaches. Negative for syncope, weakness and numbness.    Physical Exam Updated Vital Signs BP (!) 134/90 (BP Location: Right Arm)   Pulse 87   Temp 98.2 F (36.8 C) (Oral)   Resp 20   Ht 5\' 10"  (1.778 m)   Wt 102.1 kg   SpO2 95%   BMI 32.28 kg/m  Physical Exam Vitals and nursing note reviewed.  Constitutional:      Appearance: Normal appearance.  HENT:     Head: Normocephalic and atraumatic.  Eyes:     General: No scleral icterus.    Conjunctiva/sclera: Conjunctivae normal.  Pulmonary:     Effort: Pulmonary effort is normal. No  respiratory distress.  Skin:    Findings: No rash.  Neurological:     Mental Status: He is alert.     Comments: Cranial nerves II through XII intact.  Normal strength in bilateral upper and lower extremities.  No problem with finger-nose testing.  No slurred speech or facial droop.  Well-appearing and active in the treatment room  Psychiatric:        Mood and Affect: Mood normal.     ED Results / Procedures / Treatments   Labs (all labs ordered are listed, but only abnormal results are displayed) Labs Reviewed  CBC WITH DIFFERENTIAL/PLATELET  COMPREHENSIVE METABOLIC PANEL    EKG None  Radiology No results found.  Procedures Procedures   Medications Ordered in ED Medications  sodium chloride 0.9 % bolus 500 mL (500 mLs Intravenous New Bag/Given 10/23/22 1802)  prochlorperazine (COMPAZINE) injection 10 mg (10 mg Intravenous Given 10/23/22 1803)  diphenhydrAMINE (BENADRYL) injection 12.5 mg (12.5 mg Intravenous Given 10/23/22 1803)  ketorolac (TORADOL) 15 MG/ML injection 15 mg (15 mg Intravenous Given 10/23/22 1809)    ED Course/ Medical Decision Making/ A&P                             Medical Decision  Making Amount and/or Complexity of Data Reviewed Labs: ordered. Radiology: ordered.  Risk Prescription drug management.   37 year old male presenting with a headache. Emergent considerations for headache include subarachnoid hemorrhage, meningitis, temporal arteritis, glaucoma, cerebral ischemia, carotid/vertebral dissection, intracranial tumor, Venous sinus thrombosis, carbon monoxide poisoning, acute or chronic subdural hemorrhage.     This is not an exhaustive differential.    Past Medical History / Co-morbidities / Social History: No known pmh, does report constant energy drink use    Physical Exam: Pertinent physical exam findings include Normal neuroexam  Lab Tests: I ordered, and personally interpreted labs.  The pertinent results include: Within normal  limits   Imaging Studies: I ordered and independently visualized and interpreted head CT and I agree with the radiologist that there are no abnormalities    Medications: Given Compazine, Toradol and fluids.  On reevaluation patient reports feeling better.   MDM/Disposition: This is a 37 year old male who presented today with a headache.  He said it was the worst headache he has had and that it was worse with movement.  Also said that he was "concerned he was having a stroke."  Due to this history head CT was pursued and did not show any acute intracranial abnormalities.  Lab work is also benign.  He was treated with a migraine cocktail and reported feeling much better.  Suspect he had a migraine headache.  We discussed the importance of staying hydrated and cutting down on the caffeinated beverages.  At this time he is hemodynamically stable, with normal vital signs, ambulatory and stable for discharge.     Final Clinical Impression(s) / ED Diagnoses Final diagnoses:  Other migraine without status migrainosus, not intractable    Rx / DC Orders ED Discharge Orders     None      Results and diagnoses were explained to the patient. Return precautions discussed in full. Patient had no additional questions and expressed complete understanding.   This chart was dictated using voice recognition software.  Despite best efforts to proofread,  errors can occur which can change the documentation meaning.     Darliss Ridgel 10/23/22 Bertell Maria, MD 10/24/22 1043

## 2022-10-23 NOTE — ED Triage Notes (Addendum)
Pt with c/o HA for past few days.  Pt states he has not taken anything for the pain.  Pt c/o right eye pain as well.  Denies any light sensitivity or emesis, + nausea.

## 2022-10-23 NOTE — Discharge Instructions (Signed)
You came to the emergency department today with a headache.  Your CAT scan was normal.  We believe you had a migraine headache.  You can read information about this attached to these discharge papers.  Please call one of the PCPs attached to these discharge papers for general outpatient care.  It was a pleasure to meet you and we hope you feel better.  Tylenol and ibuprofen are good options for headaches over-the-counter.

## 2022-12-17 ENCOUNTER — Encounter (HOSPITAL_COMMUNITY): Payer: Self-pay

## 2022-12-17 ENCOUNTER — Other Ambulatory Visit: Payer: Self-pay

## 2022-12-17 DIAGNOSIS — Z7982 Long term (current) use of aspirin: Secondary | ICD-10-CM | POA: Insufficient documentation

## 2022-12-17 DIAGNOSIS — H5789 Other specified disorders of eye and adnexa: Secondary | ICD-10-CM | POA: Insufficient documentation

## 2022-12-17 NOTE — ED Triage Notes (Signed)
Pt reports he ate lobster and went to sleep and when he woke up his left lower eyelid swollen, red and painful.  Onset 2 days ago.

## 2022-12-18 ENCOUNTER — Emergency Department (HOSPITAL_COMMUNITY)
Admission: EM | Admit: 2022-12-18 | Discharge: 2022-12-18 | Disposition: A | Discharge status: Home / Self Care | Payer: Self-pay | Attending: Emergency Medicine | Admitting: Emergency Medicine

## 2022-12-18 DIAGNOSIS — H02845 Edema of left lower eyelid: Secondary | ICD-10-CM

## 2022-12-18 MED ORDER — PREDNISONE 50 MG PO TABS: 50.0000 mg | ORAL_TABLET | Freq: Every day | ORAL | 0 refills | Status: AC

## 2022-12-18 MED ORDER — PREDNISONE 50 MG PO TABS
60.0000 mg | ORAL_TABLET | Freq: Once | ORAL | Status: AC
  Administered 2022-12-18: 60 mg via ORAL
  Filled 2022-12-18: qty 1

## 2022-12-18 NOTE — ED Provider Notes (Signed)
[facility] Provider Note   CSN: [id number] Arrival date & time: 12/17/22  2202     History  Chief Complaint  Patient presents with   Facial Swelling    Travis Mayer is a 37 y.o. male.  The history is provided by the patient.  He woke up 2 days ago with some swelling of the lower eyelid of the left eye.  It is mildly painful but not itchy.  He has not had any drainage.  He has used a variety of eyedrops without any benefit.  He had eaten lobster the day before but does not recall any trauma to the eye.  He denies any difficulty with vision.   Home Medications Prior to Admission medications   Medication Sig Start Date End Date Taking? Authorizing Provider  Aspirin-Salicylamide-Caffeine (ARTHRITIS STRENGTH BC POWDER PO) Take 1 packet by mouth daily as needed (pain).    [provider]  benzonatate (TESSALON) 200 MG capsule Take 1 capsule (200 mg total) by mouth every 8 (eight) hours. 05/29/22   Jacqlyn Larsen, PA-C  ondansetron (ZOFRAN) 4 MG tablet Take 1 tablet (4 mg total) by mouth every 6 (six) hours. 05/29/22   Jacqlyn Larsen, PA-C      Allergies    Patient has no known allergies.    Review of Systems   Review of Systems  All other systems reviewed and are negative.   Physical Exam Updated Vital Signs BP (!) 130/95   Pulse 68   Temp 97.9 F (36.6 C) (Temporal)   Resp 15   Ht 5\' 9"  (1.753 m)   Wt 106.6 kg   SpO2 96%   BMI 34.70 kg/m  Physical Exam Vitals and nursing note reviewed.   37 year old male, resting comfortably and in no acute distress. Vital signs are significant for borderline elevated blood pressure. Oxygen saturation is 96%, which is normal. Head is normocephalic and atraumatic. PERRLA, EOMI. Oropharynx is clear.  There is mild swelling of the lower lid of the left eye.  Appearance is that of allergy and does not appear infected.  Conjunctiva is normal color.  There are no palpable  preauricular lymph nodes. Neck is nontender and supple without adenopathy. Lungs are clear without rales, wheezes, or rhonchi. Chest is nontender. Heart has regular rate and rhythm without murmur. Abdomen is soft, flat, nontender. Extremities have no cyanosis or edema, full range of motion is present. Skin is warm and dry without rash. Neurologic: Mental status is normal, cranial nerves are intact, moves all extremities equally.  ED Results / Procedures / Treatments    Procedures Procedures    Medications Ordered in ED Medications  predniSONE (DELTASONE) tablet 60 mg (has no administration in time range)    ED Course/ Medical Decision Making/ A&P                             Medical Decision Making  Swelling of the lower eyelid which appears to be allergic blepharitis.  No signs to suggest bacterial infection.  Doubt allergy to shellfish as I would expect that to have affected both eyes.  I have ordered a dose of prednisone and I am ordering a prescription for a 5-day course of prednisone.  I have advised him to use over-the-counter NSAIDs and acetaminophen as needed for pain, diphenhydramine as needed for itching (although he has not had any itching to this point).  I am referring him to ophthalmology if there is no clinical improvement in the next 2 to 3 days.  Return precautions discussed.  Final Clinical Impression(s) / ED Diagnoses Final diagnoses:  Pain and swelling of lower eyelid of left eye    Rx / DC Orders ED Discharge Orders          Ordered    predniSONE (DELTASONE) 50 MG tablet  Daily        12/18/22 [id number]              Delora Fuel, MD [id number] [phone]

## 2022-12-18 NOTE — Discharge Instructions (Addendum)
Apply cool compresses several times a day.  You may continue using the allergy eyedrops.  You may take ibuprofen or naproxen or acetaminophen as needed for pain.  You may take diphenhydramine as needed for itching.  If swelling is not improving in 2-3 days, follow-up with the ophthalmologist.  Return to the emergency department if you have any new or concerning symptoms.

## 2023-01-01 ENCOUNTER — Emergency Department (HOSPITAL_COMMUNITY)
Admission: EM | Admit: 2023-01-01 | Discharge: 2023-01-01 | Discharge status: Against Medical Advice | Payer: Self-pay | Attending: Emergency Medicine | Admitting: Emergency Medicine

## 2023-01-01 DIAGNOSIS — Z5321 Procedure and treatment not carried out due to patient leaving prior to being seen by health care provider: Secondary | ICD-10-CM | POA: Insufficient documentation

## 2023-01-01 DIAGNOSIS — H5710 Ocular pain, unspecified eye: Secondary | ICD-10-CM | POA: Diagnosis present

## 2023-05-07 ENCOUNTER — Emergency Department (HOSPITAL_COMMUNITY)
Admission: EM | Admit: 2023-05-07 | Discharge: 2023-05-07 | Disposition: A | Discharge status: Home / Self Care | Payer: Self-pay | Attending: Emergency Medicine | Admitting: Emergency Medicine

## 2023-05-07 ENCOUNTER — Other Ambulatory Visit: Payer: Self-pay

## 2023-05-07 ENCOUNTER — Encounter (HOSPITAL_COMMUNITY): Payer: Self-pay | Admitting: *Deleted

## 2023-05-07 DIAGNOSIS — U071 COVID-19: Secondary | ICD-10-CM | POA: Insufficient documentation

## 2023-05-07 LAB — SARS CORONAVIRUS 2 BY RT PCR: SARS Coronavirus 2 by RT PCR: POSITIVE — AB

## 2023-05-07 MED ORDER — PAXLOVID (300/100) 20 X 150 MG & 10 X 100MG PO TBPK: 3.0000 | ORAL_TABLET | Freq: Two times a day (BID) | ORAL | 0 refills | Status: AC

## 2023-05-07 NOTE — Discharge Instructions (Signed)
You tested positive for COVID today.  Please take Tylenol or ibuprofen for fever/pain.  You can also try DayQuil/NyQuil or Mucinex or TheraFlu for symptom relief.  I recommend close follow-up with your PCP for reevaluation.  Do not hesitate to return to the ER if new or worsening symptoms.

## 2023-05-07 NOTE — ED Notes (Signed)
Pt stated, " I think I have COVID. I have been running a fever and my body aches all over. I can barely walk sometimes. "

## 2023-05-07 NOTE — ED Provider Notes (Signed)
[facility] EM[facility]Y [facility] AT [facility] Provider Note   CSN: [id number] Arrival date & time: 05/07/23  1728     History  Chief Complaint  Patient presents with   Headache   Generalized Body Aches    Travis Mayer is a 37 y.o. male otherwise healthy presents today for evaluation of flulike symptoms.  Patient states he has had a fever, body aches in the last 2 days.  Endorses headache.  Denies chest pain, shortness of breath, nausea, vomiting, abdominal pain, bowel changes, urinary symptoms.  Patient is not vaccinated to COVID.  This is the fifth time he has COVID.   Headache     Past Medical History:  Diagnosis Date   Kidney stone    Past Surgical History:  Procedure Laterality Date   APPENDECTOMY       Home Medications Prior to Admission medications   Medication Sig Start Date End Date Taking? Authorizing Provider  Aspirin-Salicylamide-Caffeine (ARTHRITIS STRENGTH BC POWDER PO) Take 1 packet by mouth daily as needed (pain).    [provider]  ondansetron (ZOFRAN) 4 MG tablet Take 1 tablet (4 mg total) by mouth every 6 (six) hours. 05/29/22   Dartha Lodge, PA-C  predniSONE (DELTASONE) 50 MG tablet Take 1 tablet (50 mg total) by mouth daily. 12/18/22   Dione Booze, MD      Allergies    Patient has no known allergies.    Review of Systems   Review of Systems  Neurological:  Positive for headaches.    Physical Exam Updated Vital Signs BP 133/79   Pulse 91   Temp (!) 100.6 F (38.1 C) (Oral)   Resp 20   Ht 5\' 11"  (1.803 m)   Wt 108.9 kg   SpO2 97%   BMI 33.47 kg/m  Physical Exam Vitals and nursing note reviewed.  Constitutional:      Appearance: Normal appearance.  HENT:     Head: Normocephalic and atraumatic.     Mouth/Throat:     Mouth: Mucous membranes are moist.  Eyes:     General: No scleral icterus. Cardiovascular:     Rate and Rhythm: Normal rate and regular rhythm.     Pulses: Normal pulses.     Heart  sounds: Normal heart sounds.  Pulmonary:     Effort: Pulmonary effort is normal.     Breath sounds: Normal breath sounds.  Abdominal:     General: Abdomen is flat.     Palpations: Abdomen is soft.     Tenderness: There is no abdominal tenderness.  Musculoskeletal:        General: No deformity.  Skin:    General: Skin is warm.     Findings: No rash.  Neurological:     General: No focal deficit present.     Mental Status: He is alert.  Psychiatric:        Mood and Affect: Mood normal.     ED Results / Procedures / Treatments   Labs (all labs ordered are listed, but only abnormal results are displayed) Labs Reviewed  SARS CORONAVIRUS 2 BY RT PCR - Abnormal; Notable for the following components:      Result Value   SARS Coronavirus 2 by RT PCR POSITIVE (*)    All other components within normal limits    EKG None  Radiology No results found.  Procedures Procedures    Medications Ordered in ED Medications - No data to display  ED Course/ Medical Decision Making/  A&P                                 Medical Decision Making  This patient presents to the ED for sore throat, body aches, this involves an extensive number of treatment options, and is a complaint that carries with a high risk of complications and morbidity.  The differential diagnosis includes flu, COVID, RSV, strep, pharyngitis, bronchitis, pneumonia, infectious etiology.  This is not an exhaustive list.  Lab tests: I ordered and personally interpreted labs.  The pertinent results include: Viral panel positive for COVID.  Imaging studies:  Problem list/ ED course/ Critical interventions/ Medical management: HPI: See above Vital signs within normal range and stable throughout visit. Laboratory/imaging studies significant for: See above. On physical examination, patient is afebrile and appears in no acute distress. This patient presents with symptoms suspicious for COVID. Based on history and physical  doubt sinusitis. COVID test was positive. Do not suspect underlying cardiopulmonary process. I considered, but think unlikely, pneumonia. Patient is nontoxic appearing and not in need of emergent medical intervention. Patient told to self isolate at home until symptoms subside for 72 hours. Recommended patient to take TheraFlu or Mucinex for symptom relief.  Follow-up with primary care physician for further evaluation and management.  Return to the ER if new or worsening symptoms. I have reviewed the patient home medicines and have made adjustments as needed.  Cardiac monitoring/EKG: The patient was maintained on a cardiac monitor.  I personally reviewed and interpreted the cardiac monitor which showed an underlying rhythm of: sinus rhythm.  Additional history obtained: External records from outside source obtained and reviewed including: Chart review including previous notes, labs, imaging.  Consultations obtained:  Disposition Continued outpatient therapy. Follow-up with PCP recommended for reevaluation of symptoms. Treatment plan discussed with patient.  Pt acknowledged understanding was agreeable to the plan. Worrisome signs and symptoms were discussed with patient, and patient acknowledged understanding to return to the ED if they noticed these signs and symptoms. Patient was stable upon discharge.   This chart was dictated using voice recognition software.  Despite best efforts to proofread,  errors can occur which can change the documentation meaning.          Final Clinical Impression(s) / ED Diagnoses Final diagnoses:  COVID    Rx / DC Orders ED Discharge Orders          Ordered    nirmatrelvir & ritonavir (PAXLOVID, 300/100,) 20 x 150 MG & 10 x 100MG  TBPK  2 times daily        05/07/23 2026              Jeanelle Malling, PA 05/07/23 2028    Vanetta Mulders, MD 05/09/23 [id number]

## 2023-05-07 NOTE — ED Triage Notes (Signed)
+   HA, + chills, + generalized weakness, + fever, + cough - productive of yellow sputum .  Symptoms started two days ago. Took tylenol last night.
